# Patient Record
Sex: Female | Born: 1990 | Hispanic: No | Marital: Single | Smoking: Former smoker
Health system: Southern US, Community
[De-identification: ages and names within clinical notes are randomized; demographics above are authoritative.]

## PROBLEM LIST (undated history)

## (undated) DIAGNOSIS — F419 Anxiety disorder, unspecified: Secondary | ICD-10-CM

## (undated) DIAGNOSIS — F909 Attention-deficit hyperactivity disorder, unspecified type: Secondary | ICD-10-CM

## (undated) DIAGNOSIS — D649 Anemia, unspecified: Secondary | ICD-10-CM

## (undated) DIAGNOSIS — F32A Depression, unspecified: Secondary | ICD-10-CM

## (undated) DIAGNOSIS — F329 Major depressive disorder, single episode, unspecified: Secondary | ICD-10-CM

## (undated) HISTORY — DX: Anemia, unspecified: D64.9

## (undated) HISTORY — DX: Attention-deficit hyperactivity disorder, unspecified type: F90.9

## (undated) HISTORY — PX: WISDOM TOOTH EXTRACTION: SHX21

## (undated) HISTORY — PX: EYE SURGERY: SHX253

---
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
## 2011-01-27 ENCOUNTER — Encounter (INDEPENDENT_AMBULATORY_CARE_PROVIDER_SITE_OTHER): Payer: Medicaid Other | Admitting: Psychology

## 2011-01-27 ENCOUNTER — Encounter (HOSPITAL_COMMUNITY): Payer: Medicaid Other | Admitting: Psychology

## 2011-01-27 DIAGNOSIS — F411 Generalized anxiety disorder: Secondary | ICD-10-CM

## 2014-09-09 ENCOUNTER — Encounter (HOSPITAL_COMMUNITY): Payer: Self-pay | Admitting: *Deleted

## 2014-09-09 ENCOUNTER — Emergency Department (HOSPITAL_COMMUNITY)
Admission: EM | Admit: 2014-09-09 | Discharge: 2014-09-09 | Disposition: A | Discharge status: Home / Self Care | Payer: BLUE CROSS/BLUE SHIELD | Attending: Emergency Medicine | Admitting: Emergency Medicine

## 2014-09-09 DIAGNOSIS — F332 Major depressive disorder, recurrent severe without psychotic features: Secondary | ICD-10-CM

## 2014-09-09 DIAGNOSIS — F419 Anxiety disorder, unspecified: Secondary | ICD-10-CM | POA: Insufficient documentation

## 2014-09-09 DIAGNOSIS — Z79899 Other long term (current) drug therapy: Secondary | ICD-10-CM | POA: Insufficient documentation

## 2014-09-09 DIAGNOSIS — F329 Major depressive disorder, single episode, unspecified: Secondary | ICD-10-CM | POA: Insufficient documentation

## 2014-09-09 DIAGNOSIS — F1310 Sedative, hypnotic or anxiolytic abuse, uncomplicated: Secondary | ICD-10-CM | POA: Diagnosis not present

## 2014-09-09 DIAGNOSIS — Z3202 Encounter for pregnancy test, result negative: Secondary | ICD-10-CM | POA: Diagnosis not present

## 2014-09-09 DIAGNOSIS — F32A Depression, unspecified: Secondary | ICD-10-CM

## 2014-09-09 HISTORY — DX: Depression, unspecified: F32.A

## 2014-09-09 HISTORY — DX: Anxiety disorder, unspecified: F41.9

## 2014-09-09 HISTORY — DX: Major depressive disorder, recurrent severe without psychotic features: F33.2

## 2014-09-09 HISTORY — DX: Major depressive disorder, single episode, unspecified: F32.9

## 2014-09-09 LAB — CBC
HEMATOCRIT: 43 % (ref 36.0–46.0)
HEMOGLOBIN: 14.4 g/dL (ref 12.0–15.0)
MCH: 29.7 pg (ref 26.0–34.0)
MCHC: 33.5 g/dL (ref 30.0–36.0)
MCV: 88.7 fL (ref 78.0–100.0)
Platelets: 176 10*3/uL (ref 150–400)
RBC: 4.85 MIL/uL (ref 3.87–5.11)
RDW: 13 % (ref 11.5–15.5)
WBC: 4.4 10*3/uL (ref 4.0–10.5)

## 2014-09-09 LAB — COMPREHENSIVE METABOLIC PANEL
ALT: 11 U/L (ref 0–35)
AST: 15 U/L (ref 0–37)
Albumin: 5 g/dL (ref 3.5–5.2)
Alkaline Phosphatase: 57 U/L (ref 39–117)
Anion gap: 8 (ref 5–15)
BUN: 15 mg/dL (ref 6–23)
CO2: 26 mmol/L (ref 19–32)
CREATININE: 0.68 mg/dL (ref 0.50–1.10)
Calcium: 9.4 mg/dL (ref 8.4–10.5)
Chloride: 105 mmol/L (ref 96–112)
GFR calc Af Amer: >90 mL/min (ref >90)
Glucose, Bld: 88 mg/dL (ref 70–99)
Potassium: 3.7 mmol/L (ref 3.5–5.1)
Sodium: 139 mmol/L (ref 135–145)
Total Bilirubin: 0.8 mg/dL (ref 0.3–1.2)
Total Protein: 7.7 g/dL (ref 6.0–8.3)

## 2014-09-09 LAB — ACETAMINOPHEN LEVEL: Acetaminophen (Tylenol), Serum: <10 ug/mL — ABNORMAL LOW (ref 10–30)

## 2014-09-09 LAB — RAPID URINE DRUG SCREEN, HOSP PERFORMED
AMPHETAMINES: NOT DETECTED
BARBITURATES: NOT DETECTED
Benzodiazepines: POSITIVE — AB
COCAINE: NOT DETECTED
Opiates: NOT DETECTED
TETRAHYDROCANNABINOL: NOT DETECTED

## 2014-09-09 LAB — PREGNANCY, URINE: Preg Test, Ur: NEGATIVE

## 2014-09-09 LAB — ETHANOL: Alcohol, Ethyl (B): <5 mg/dL (ref 0–9)

## 2014-09-09 LAB — SALICYLATE LEVEL: Salicylate Lvl: <4 mg/dL (ref 2.8–20.0)

## 2014-09-09 NOTE — ED Notes (Signed)
Patient overwhelmed with college and work. She has also been off of her medication for depression due changes in  Insurance.

## 2014-09-09 NOTE — ED Notes (Signed)
Family took home pt belongings

## 2014-09-09 NOTE — ED Notes (Signed)
Bed: [facility] Expected date:  Expected time:  Means of arrival:  Comments: Hold for triage 4 

## 2014-09-09 NOTE — Progress Notes (Signed)
pcp is camille andy EPIC updated

## 2014-09-09 NOTE — ED Notes (Signed)
Family at bedside. 

## 2014-09-09 NOTE — BH Assessment (Addendum)
Tele Assessment Note   Shelly Hill is an 24 y.o. female who came to the Emergency Department due to feeling"overwhelmed and unable to function in daily tasks". She states she is in her last year of college at [employer] and is overwhelmed and anxious about finishing school. She states she was taking medication up until last July when her insurance changed from [place] to [place]. She states she thought she would be ok without her medication and didn't think she could afford it without the Medicaid. She reports now having panic attacks almost daily and is not eating or sleeping. She states she has been having some suicidal thoughts and "is afraid of what would happen if she went home today". She states that she used to take Celexa  daily and xanex .25 as needed. She says she had some left over Celexa and tried to restart herself on the medication over the past 5 days. Pt denies HI or A/V hallucinations but endorses depression and overwhelming anxiety. She denies any abuse of substances.   Disposition: inpatient treatment recommended by Julieanne Cotton NP  Axis I: 296.23 Major Depressive Disorder, single episode, severe, 300.02 Generalized Anxiety Disorder Axis II: Deferred Axis III:  Past Medical History  Diagnosis Date  . Depression   . Anxiety    Axis IV: educational problems and other psychosocial or environmental problems Axis V: 41-50 serious symptoms  Past Medical History:  Past Medical History  Diagnosis Date  . Depression   . Anxiety     History reviewed. No pertinent past surgical history.  Family History: No family history on file.  Social History:  reports that she has never smoked. She does not have any smokeless tobacco history on file. She reports that she does not drink alcohol. Her drug history is not on file.  Additional Social History:  Alcohol / Drug Use History of alcohol / drug use?: No history of alcohol / drug abuse  CIWA: CIWA-Ar BP: 128/81 mmHg Pulse Rate:  73 COWS:    PATIENT STRENGTHS: (choose at least two) Average or above average intelligence General fund of knowledge Motivation for treatment/growth Supportive family/friends  Allergies: No Known Allergies  Home Medications:  (Not in a hospital admission)  OB/GYN Status:  Patient's last menstrual period was 08/31/2014.  General Assessment Data Location of Assessment: [facility] Is this a Tele or Face-to-Face Assessment?: Face-to-Face Is this an Initial Assessment or a Re-assessment for this encounter?: Initial Assessment Living Arrangements: Non-relatives/Friends Can pt return to current living arrangement?: Yes Admission Status: Voluntary Is patient capable of signing voluntary admission?: Yes Transfer from: Home     [facility] Crisis Care Plan Living Arrangements: Non-relatives/Friends Name of Psychiatrist: Dr. Angelina Pih Name of Therapist: Rosario Jacks  Education Status Is patient currently in school?: Yes Current Grade: College Highest grade of school patient has completed: 12th  Name of school: [employer]  Risk to self with the past 6 months Suicidal Ideation: Yes-Currently Present Suicidal Intent: No Is patient at risk for suicide?: Yes Suicidal Plan?: No Access to Means: No What has been your use of drugs/alcohol within the last 12 months?: Denies substance abuse Previous Attempts/Gestures: No How many times?: 0 Other Self Harm Risks: none Triggers for Past Attempts: None known Intentional Self Injurious Behavior: None Family Suicide History: No Recent stressful life event(s): Other (Comment) (Last semester of college) Persecutory voices/beliefs?: No Depression: Yes Depression Symptoms: Despondent, Tearfulness Substance abuse history and/or treatment for substance abuse?: No Suicide prevention information given to non-admitted patients: Not applicable  Risk to Others within the past 6 months Homicidal Ideation: No Thoughts of Harm to Others: No Current Homicidal  Intent: No Current Homicidal Plan: No Access to Homicidal Means: No Identified Victim: None (None) History of harm to others?: No Assessment of Violence: None Noted Violent Behavior Description: None Does patient have access to weapons?: No Criminal Charges Pending?: No Does patient have a court date: No  Psychosis Hallucinations: None noted Delusions: None noted  Mental Status Report Appear/Hygiene: Unremarkable Eye Contact: Good Motor Activity: Freedom of movement Speech: Unremarkable Level of Consciousness: Alert Mood: Depressed, Anxious Affect: Appropriate to circumstance Anxiety Level: Panic Attacks Panic attack frequency: "almost daily" Most recent panic attack: today Thought Processes: Coherent Judgement: Unimpaired Orientation: Person, Place, Time, Situation Obsessive Compulsive Thoughts/Behaviors: Unable to Assess  Cognitive Functioning Concentration: Decreased Memory: Recent Intact, Remote Intact IQ: Average Insight: Fair Impulse Control: Good Appetite: Poor Sleep: Decreased Total Hours of Sleep:  (4)  ADLScreening [facility] Assessment Services) Patient's cognitive ability adequate to safely complete daily activities?: Yes Patient able to express need for assistance with ADLs?: Yes Independently performs ADLs?: Yes (appropriate for developmental age)  Prior Inpatient Therapy Prior Inpatient Therapy: No  Prior Outpatient Therapy Prior Outpatient Therapy: Yes Prior Therapy Dates:  (july 2015) Prior Therapy Facilty/Provider(s): [facility] Reason for Treatment: Anxiety  ADL Screening (condition at time of admission) Patient's cognitive ability adequate to safely complete daily activities?: Yes Is the patient deaf or have difficulty hearing?: No Does the patient have difficulty concentrating, remembering, or making decisions?: No Patient able to express need for assistance with ADLs?: Yes Does the patient have difficulty dressing or bathing?:  No Independently performs ADLs?: Yes (appropriate for developmental age) Does the patient have difficulty walking or climbing stairs?: No Weakness of Legs: None Weakness of Arms/Hands: None  Home Assistive Devices/Equipment Home Assistive Devices/Equipment: None  Therapy Consults (therapy consults require a physician order) PT Evaluation Needed: No OT Evalulation Needed: No SLP Evaluation Needed: No Abuse/Neglect Assessment (Assessment to be complete while patient is alone) Physical Abuse: Denies Verbal Abuse: Denies Sexual Abuse: Denies Exploitation of patient/patient's resources: Denies Self-Neglect: Denies Values / Beliefs Cultural Requests During Hospitalization: None Spiritual Requests During Hospitalization: None Consults Spiritual Care Consult Needed: No Social Work Consult Needed: No Merchant navy officerAdvance Directives (For Healthcare) Does patient have an advance directive?: No Would patient like information on creating an advanced directive?: No - patient declined information    Additional Information 1:1 In Past 12 Months?: No CIRT Risk: No Elopement Risk: No Does patient have medical clearance?: Yes     Disposition:  Disposition Initial Assessment Completed for this Encounter: Yes Disposition of Patient: Inpatient treatment program Type of inpatient treatment program: Adult  Amarie Viles 09/09/2014 2:56 PM

## 2014-09-09 NOTE — B[facility] Counselor (Signed)
[facility] accepting Dr. Caroline MoreKomissarva report # [phone]  Kateri PlummerKristin Kahil Agner, M.S., LPCA, [place]AS[place], [facility] Licensed Professional Counselor Associate  Triage Specialist  [facility]  Therapeutic Triage Services Phone: [phone] Fax: [phone]

## 2014-09-09 NOTE — ED Notes (Signed)
Pt states she has been feeling increasingly depressed and anxious for the past 2 weeks. Pt was on citalopram for 3 years, then stopped in July of 2015 due to her new insurance not covering the medication. Pt started back on citalopram 5 days ago. Pt states she feels stressed with school. Pt is currently a senior at [employer]. Pt states she has had a lack of appetite for the past 2 weeks and has been unable to eat much. Pt denies HI, states she has thoughts of suicide without intent.

## 2014-09-09 NOTE — BH Assessment (Signed)
Writer informed TTS (K.Chesire) of the consult.  

## 2014-09-09 NOTE — ED Provider Notes (Signed)
CSN: [id number]     Arrival date & time 09/09/14  1148 History   First MD Initiated Contact with Patient 09/09/14 1332     Chief Complaint  Patient presents with  . Depression  . Anxiety     (Consider location/radiation/quality/duration/timing/severity/associated sxs/prior Treatment) Patient is a 24 y.o. female presenting with altered mental status. The history is provided by the patient (the pt complains of depression and having suicidal thoughts).  Altered Mental Status Presenting symptoms: no confusion   Severity:  Severe Most recent episode:  More than 2 days ago Episode history:  Continuous Timing:  Constant Progression:  Worsening Chronicity:  Recurrent Context: not dementia   Associated symptoms: no abdominal pain, no hallucinations, no headaches, no rash and no seizures     Past Medical History  Diagnosis Date  . Depression   . Anxiety    History reviewed. No pertinent past surgical history. No family history on file. History  Substance Use Topics  . Smoking status: Never Smoker   . Smokeless tobacco: Not on file  . Alcohol Use: No   OB History    No data available     Review of Systems  Constitutional: Negative for appetite change and fatigue.  HENT: Negative for congestion, ear discharge and sinus pressure.   Eyes: Negative for discharge.  Respiratory: Negative for cough.   Cardiovascular: Negative for chest pain.  Gastrointestinal: Negative for abdominal pain and diarrhea.  Genitourinary: Negative for frequency and hematuria.  Musculoskeletal: Negative for back pain.  Skin: Negative for rash.  Neurological: Negative for seizures and headaches.  Psychiatric/Behavioral: Positive for dysphoric mood. Negative for hallucinations and confusion.      Allergies  Review of patient's allergies indicates no known allergies.  Home Medications   Prior to Admission medications   Medication Sig Start Date End Date Taking? Authorizing Provider  ALPRAZolam  (XANAX) 0.25 MG tablet Take 0.25 mg by mouth 2 (two) times daily as needed for anxiety.   Yes Historical Provider, MD  citalopram (CELEXA) 10 MG tablet Take 10 mg by mouth at bedtime.   Yes Historical Provider, MD  ibuprofen (ADVIL,MOTRIN) 200 MG tablet Take 400 mg by mouth every 6 (six) hours as needed (pain).   Yes Historical Provider, MD   BP 128/81 mmHg  Pulse 73  Temp(Src) 98.2 F (36.8 C) (Oral)  Resp 18  SpO2 100%  LMP 08/31/2014 Physical Exam  Constitutional: She is oriented to person, place, and time. She appears well-developed.  HENT:  Head: Normocephalic.  Eyes: Conjunctivae and EOM are normal. No scleral icterus.  Neck: Neck supple. No thyromegaly present.  Cardiovascular: Normal rate and regular rhythm.  Exam reveals no gallop and no friction rub.   No murmur heard. Pulmonary/Chest: No stridor. She has no wheezes. She has no rales. She exhibits no tenderness.  Abdominal: She exhibits no distension. There is no tenderness. There is no rebound.  Musculoskeletal: Normal range of motion. She exhibits no edema.  Lymphadenopathy:    She has no cervical adenopathy.  Neurological: She is oriented to person, place, and time. She exhibits normal muscle tone. Coordination normal.  Skin: No rash noted. No erythema.  Psychiatric:  Pt is depressed and having some suicidal thoughts no plan    ED Course  Procedures (including critical care time) Labs Review Labs Reviewed  ACETAMINOPHEN LEVEL - Abnormal; Notable for the following:    Acetaminophen (Tylenol), Serum <10.0 (*)    All other components within normal limits  URINE RAPID DRUG SCREEN (  HOSP PERFORMED) - Abnormal; Notable for the following:    Benzodiazepines POSITIVE (*)    All other components within normal limits  CBC  COMPREHENSIVE METABOLIC PANEL  ETHANOL  SALICYLATE LEVEL  PREGNANCY, URINE    Imaging Review No results found.   EKG Interpretation None      MDM   Final diagnoses:  None    Admit to  psyc for depression    Bethann BerkshireJoseph Farrie Sann, MD 09/09/14 1536

## 2014-09-09 NOTE — BH Assessment (Signed)
B[facility] Assessment Progress Note  This pt has been referred to the following facilities with results as noted:  Beds available, information faxed, decision pending[facility]  No beds available, but willing to accept referral:  [facility]  Krissi Willaims, [place] Triage Specialist 09/09/2014 @ 16:37

## 2014-09-09 NOTE — Consult Note (Signed)
[facility]   Reason for Consult: Recurrent major depression, Anxiety disorder Referring Physician: EDP Patient Identification: Shelly Dingwallllison Ricardo MRN:  [id number] Principal Diagnosis: Severe recurrent major depression without psychotic features Diagnosis:   Patient Active Problem List   Diagnosis Date Noted  . Severe recurrent major depression without psychotic features [F33.2] 09/09/2014    Priority: High    Total Time spent with patient: 1 hour  Subjective:   Shelly Hill is a 24 y.o. female patient admitted with Recurrent major depression, Anxiety.  HPI:  Caucasian student female was evaluated for recurrent Major depression.  She stated that she stopped taking her Celexa last July when her insurance changed.  She did so hoping that her Depression will not be recurring.  She also stated that combined with her school work and working in the evening at a coffee shop, she started feeling depressed and anxious.  She reported having suicidal thought last week with no plans.  Patient was tearful during the assessment.  She is a final year student and stated that she is overwhelmed.  Today she denies SI/HI/AVH and she denies use  Of drug or Alcohol.  She denies any previous hx of wanting to hurt self.  She is accepted for admission and we will be looking for inpatient Psychiatric bed for admission.  HPI Elements:   Location:  Recurrent Major depression, anxiety disorder, . Quality:  severe, hypersomnia, loss of appetite, anxiety. Severity:  severe. Timing:  Acute. Duration:  Chronic depression. Context:  Seeking treatment for depression..  Past Medical History:  Past Medical History  Diagnosis Date  . Depression   . Anxiety    History reviewed. No pertinent past surgical history. Family History: No family history on file. Social History:  History  Alcohol Use No     History  Drug Use Not on file    History   Social History  . Marital Status: Single     Spouse Name: N/A  . Number of Children: N/A  . Years of Education: N/A   Social History Main Topics  . Smoking status: Never Smoker   . Smokeless tobacco: Not on file  . Alcohol Use: No  . Drug Use: Not on file  . Sexual Activity: Not on file   Other Topics Concern  . None   Social History Narrative  . None   Additional Social History:    History of alcohol / drug use?: No history of alcohol / drug abuse    Allergies:  No Known Allergies  Vitals: Blood pressure 128/81, pulse 73, temperature 98.2 F (36.8 C), temperature source Oral, resp. rate 18, last menstrual period 08/31/2014, SpO2 100 %.  Risk to Self: Suicidal Ideation: Yes-Currently Present Suicidal Intent: No Is patient at risk for suicide?: Yes Suicidal Plan?: No Access to Means: No What has been your use of drugs/alcohol within the last 12 months?: Denies substance abuse How many times?: 0 Other Self Harm Risks: none Triggers for Past Attempts: None known Intentional Self Injurious Behavior: None Risk to Others: Homicidal Ideation: No Thoughts of Harm to Others: No Current Homicidal Intent: No Current Homicidal Plan: No Access to Homicidal Means: No Identified Victim: None (None) History of harm to others?: No Assessment of Violence: None Noted Violent Behavior Description: None Does patient have access to weapons?: No Criminal Charges Pending?: No Does patient have a court date: No Prior Inpatient Therapy: Prior Inpatient Therapy: No Prior Outpatient Therapy: Prior Outpatient Therapy: Yes Prior Therapy Dates:  (july 2015)  Prior Therapy Facilty/Provider(s): [facility] Reason for Treatment: Anxiety  No current facility-administered medications for this encounter.   Current Outpatient Prescriptions  Medication Sig Dispense Refill  . ALPRAZolam (XANAX) 0.25 MG tablet Take 0.25 mg by mouth 2 (two) times daily as needed for anxiety.    . citalopram (CELEXA) 10 MG tablet Take 10 mg by mouth at  bedtime.    Marland Kitchen ibuprofen (ADVIL,MOTRIN) 200 MG tablet Take 400 mg by mouth every 6 (six) hours as needed (pain).      Musculoskeletal: Strength & Muscle Tone: within normal limits Gait & Station: normal Patient leans: N/A  Psychiatric Specialty Exam:     Blood pressure 128/81, pulse 73, temperature 98.2 F (36.8 C), temperature source Oral, resp. rate 18, last menstrual period 08/31/2014, SpO2 100 %.There is no height or weight on file to calculate BMI.  General Appearance: Casual and Fairly Groomed  Eye Contact::  Good  Speech:  Clear and Coherent and Normal Rate  Volume:  Normal  Mood:  Anxious and Depressed  Affect:  Congruent, Depressed and Tearful  Thought Process:  Coherent, Goal Directed and Intact  Orientation:  Full (Time, Place, and Person)  Thought Content:  WDL  Suicidal Thoughts:  No  Homicidal Thoughts:  No  Memory:  Immediate;   Good Recent;   Good Remote;   Good  Judgement:  Good  Insight:  Good  Psychomotor Activity:  Normal  Concentration:  Good  Recall:  Good  Fund of Knowledge:Good  Language: Good  Akathisia:  NA  Handed:  Right  AIMS (if indicated):     Assets:  Desire for Improvement  ADL's:  Intact  Cognition: WNL  Sleep:      Medical Decision Making: Established Problem, Worsening (2), Review of Medication Regimen & Side Effects (2) and Review of New Medication or Change in Dosage (2)  Treatment Plan Summary: Daily contact with patient to assess and evaluate symptoms and progress in treatment, Medication management and Plan Admit to inpatient Psychiatric unit  Plan:  Recommend psychiatric Inpatient admission when medically cleared. Disposition: see above  Earney Navy   PMHNP-BC 09/09/2014 4:26 PM Patient seen face-to-face for psychiatric evaluation, chart reviewed and case discussed with the physician extender and developed treatment plan. Reviewed the information documented and agree with the treatment plan. Thedore Mins, MD

## 2019-04-17 ENCOUNTER — Other Ambulatory Visit: Payer: Self-pay

## 2019-04-17 ENCOUNTER — Ambulatory Visit
Admission: EM | Admit: 2019-04-17 | Discharge: 2019-04-17 | Disposition: A | Discharge status: Home / Self Care | Payer: BLUE CROSS/BLUE SHIELD

## 2019-04-17 DIAGNOSIS — R110 Nausea: Secondary | ICD-10-CM | POA: Insufficient documentation

## 2019-04-17 DIAGNOSIS — Z3202 Encounter for pregnancy test, result negative: Secondary | ICD-10-CM

## 2019-04-17 DIAGNOSIS — Z20822 Contact with and (suspected) exposure to covid-19: Secondary | ICD-10-CM

## 2019-04-17 DIAGNOSIS — F329 Major depressive disorder, single episode, unspecified: Secondary | ICD-10-CM | POA: Insufficient documentation

## 2019-04-17 DIAGNOSIS — Z20828 Contact with and (suspected) exposure to other viral communicable diseases: Secondary | ICD-10-CM | POA: Insufficient documentation

## 2019-04-17 DIAGNOSIS — F3289 Other specified depressive episodes: Secondary | ICD-10-CM

## 2019-04-17 DIAGNOSIS — F411 Generalized anxiety disorder: Secondary | ICD-10-CM

## 2019-04-17 DIAGNOSIS — R140 Abdominal distension (gaseous): Secondary | ICD-10-CM | POA: Insufficient documentation

## 2019-04-17 DIAGNOSIS — F32A Depression, unspecified: Secondary | ICD-10-CM

## 2019-04-17 DIAGNOSIS — R109 Unspecified abdominal pain: Secondary | ICD-10-CM

## 2019-04-17 LAB — POCT URINALYSIS DIP (MANUAL ENTRY)
Bilirubin, UA: NEGATIVE
Glucose, UA: NEGATIVE mg/dL
Ketones, POC UA: NEGATIVE mg/dL
Leukocytes, UA: NEGATIVE
Nitrite, UA: NEGATIVE
Protein Ur, POC: NEGATIVE mg/dL
Spec Grav, UA: 1.02 (ref 1.010–1.025)
Urobilinogen, UA: 0.2 E.U./dL
pH, UA: 7.5 (ref 5.0–8.0)

## 2019-04-17 LAB — POCT URINE PREGNANCY: Preg Test, Ur: NEGATIVE

## 2019-04-17 MED ORDER — DICYCLOMINE HCL 20 MG PO TABS: 20.0000 mg | ORAL_TABLET | Freq: Two times a day (BID) | ORAL | 0 refills | Status: DC

## 2019-04-17 MED ORDER — HYDROXYZINE HCL 25 MG PO TABS: 25.0000 mg | ORAL_TABLET | Freq: Four times a day (QID) | ORAL | 0 refills | Status: DC | PRN

## 2019-04-17 NOTE — Discharge Instructions (Signed)
COVID symptoms: Nausea and diarrhea Urine did not show signs of infection.  Some trace blood Urine pregnancy was negative Urine culture sent.  We will call you with abnormal results.   COVID testing ordered.  It will take between 5-7 days for test results.  Someone will contact you regarding abnormal results.    In the meantime: You should remain isolated in your home for 10 days from symptom onset AND greater than 72 hours after symptoms resolution (absence of fever without the use of fever-reducing medication and improvement in respiratory symptoms), whichever is longer Get plenty of rest and push fluids Declines stool studies at this time Based on symptoms we will try bentyl for abdominal cramping, diarrhea, and nausea Continue with zofran as needed Use OTC medications like ibuprofen or tylenol as needed fever or pain Follow up with Dr. Holly Bodily to establish care and for further evaluation and management in symptoms persists Call or go to the ED if you have any new or worsening symptoms such as fever, worsening cough, shortness of breath, chest tightness, chest pain, turning blue, changes in mental status, etc...   Depression/ Anxiety: Rest and drink fluids Eat a well-balanced diet, and avoid excessive caffeine intake Hydroxyzine prescribed.  Take as directed for anxiety Some things you may try doing to help alleviate your symptoms include: keeping a journal, exercise, talking to a friend or relative, listening to music, going for a walk or hike outside, or other activities that you may find enjoyable Follow up with Dr. Holly Bodily for further evaluation and management  Return or go to ER if you have any new or worsening symptoms such as suicidal or homicidal thoughts, fever, chills, fatigue, worsening shortness of breath, wheezing, chest pain, nausea, vomiting, abdominal pain, changes in bowel or bladder habits, etc..Marland Kitchen

## 2019-04-17 NOTE — ED Provider Notes (Addendum)
[facility]   [id number] 04/17/19 Arrival Time: 1354   CC: Nausea, diarrhea; anxiety and depression  SUBJECTIVE: History from: patient.  Shelly Dingwallllison Newsom is a 28 y.o. female who presents with nausea and watery/loose diarrhea, x 1-2 episodes daily, for the past week.  Denies sick exposure to COVID, flu or strep.  Denies recent travel.  Denies changes in diet, or medication changes.  Has tried zofran and diet changes (FODMAP diet) with minimal relief.  Keep sip of water down without difficulty.  Symptoms are made worse with anxious situation and "getting up."  Denies previous symptoms in the past.   Also mentions associated fatigue, rhinorrhea, congestion, abdominal bloating, and abdominal cramping.  Denies fever, chills, sore throat, cough, SOB, wheezing, chest pain, vomiting, melena, hematochezia, dysuria, urinary frequency or urgency, vaginal discharge, vaginal itching, vaginal pain.    Patient's last menstrual period was 03/13/2019.  [facility]  Took a home pregnancy test that was negative.  Is sexually active.    Patient also complains of worsening depression and anxiety for the past couple of weeks.  Has struggled with anxiety and depression for approximately 10 years.  Denies a precipitating event, or recent stresor.  Currently taking wellbutrin 300 mg and propanolol 20 mg.  States symptoms have been well controlled until recently.  Reports similar symptoms in the past that improved with hydroxyzine. Currently being followed by a psychiatrist, but recently moved and is not established with a PCP in town.  Denies HI or SI.  Reports "brain fog," lightheadedness, fatigue, nausea, and increased crying spells. Patient denies fever, chills, anhedonia, difficulty sleeping, changes in normal activities.  ROS: As per HPI.  All other pertinent ROS negative.     Past Medical History:  Diagnosis Date  . Anxiety   . Depression    History reviewed. No pertinent surgical history. No  Known Allergies No current facility-administered medications on file prior to encounter.    Current Outpatient Medications on File Prior to Encounter  Medication Sig Dispense Refill  . buPROPion (WELLBUTRIN XL) 300 MG 24 hr tablet Take 300 mg by mouth daily.    . drospirenone-ethinyl estradiol (YAZ) 3-0.02 MG tablet Take by mouth.    . ondansetron (ZOFRAN) 4 MG tablet Take 4 mg by mouth every 8 (eight) hours as needed for nausea or vomiting.    Marland Kitchen. ALPRAZolam (XANAX) 0.25 MG tablet Take 0.25 mg by mouth 2 (two) times daily as needed for anxiety.    Marland Kitchen. ibuprofen (ADVIL,MOTRIN) 200 MG tablet Take 400 mg by mouth every 6 (six) hours as needed (pain).    . propranolol (INDERAL) 10 MG tablet Take 20 mg by mouth 3 (three) times daily.    . [DISCONTINUED] citalopram (CELEXA) 10 MG tablet Take 10 mg by mouth at bedtime.     Social History   Socioeconomic History  . Marital status: Single    Spouse name: Not on file  . Number of children: Not on file  . Years of education: Not on file  . Highest education level: Not on file  Occupational History  . Not on file  Social Needs  . Financial resource strain: Not on file  . Food insecurity    Worry: Not on file    Inability: Not on file  . Transportation needs    Medical: Not on file    Non-medical: Not on file  Tobacco Use  . Smoking status: Never Smoker  Substance and Sexual Activity  . Alcohol use: No  .  Drug use: Not on file  . Sexual activity: Not on file  Lifestyle  . Physical activity    Days per week: Not on file    Minutes per session: Not on file  . Stress: Not on file  Relationships  . Social Herbalist on phone: Not on file    Gets together: Not on file    Attends religious service: Not on file    Active member of club or organization: Not on file    Attends meetings of clubs or organizations: Not on file    Relationship status: Not on file  . Intimate partner violence    Fear of current or ex partner: Not on  file    Emotionally abused: Not on file    Physically abused: Not on file    Forced sexual activity: Not on file  Other Topics Concern  . Not on file  Social History Narrative  . Not on file   History reviewed. No pertinent family history.  OBJECTIVE:  Vitals:   04/17/19 1408  BP: 118/79  Pulse: 87  Resp: 20  Temp: 98.3 F (36.8 C)  SpO2: 96%    General appearance: alert; appears mildly fatigued, but nontoxic; speaking in full sentences and tolerating own secretions HEENT: NCAT; Ears: EACs clear, TMs pearly gray; Eyes: PERRL.  EOM grossly intact.  Nose: nares patent without rhinorrhea, Throat: oropharynx clear, tonsils non erythematous or enlarged, uvula midline  Neck: supple without LAD Lungs: unlabored respirations, symmetrical air entry; cough: absent; no respiratory distress; CTAB Heart: regular rate and rhythm.  Radial pulses 2+ symmetrical bilaterally  Back: no CVA tenderness Abdomen: soft, nondistended, normal active bowel sounds; NTTP; no guarding  Skin: warm and dry Psychological: alert and cooperative; mildly depressed mood and affect  ASSESSMENT & PLAN:  1. Suspected COVID-19 virus infection   2. Nausea without vomiting   3. Abdominal cramping   4. Abdominal bloating   5. Generalized anxiety disorder   6. Depression, unspecified depression type     Meds ordered this encounter  Medications  . dicyclomine (BENTYL) 20 MG tablet    Sig: Take 1 tablet (20 mg total) by mouth 2 (two) times daily.    Dispense:  20 tablet    Refill:  0    Order Specific Question:   Supervising Provider    Answer:   Raylene Everts [[id number]]  . hydrOXYzine (ATARAX/VISTARIL) 25 MG tablet    Sig: Take 1 tablet (25 mg total) by mouth every 6 (six) hours as needed for anxiety or nausea.    Dispense:  30 tablet    Refill:  0    Order Specific Question:   Supervising Provider    Answer:   Raylene Everts [[id number]]   COVID symptoms: Nausea and diarrhea Urine did not show  signs of infection.  Some trace blood Urine pregnancy was negative Urine culture sent.  We will call you with abnormal results.   COVID testing ordered.  It will take between 5-7 days for test results.  Someone will contact you regarding abnormal results.    In the meantime: You should remain isolated in your home for 10 days from symptom onset AND greater than 72 hours after symptoms resolution (absence of fever without the use of fever-reducing medication and improvement in respiratory symptoms), whichever is longer Get plenty of rest and push fluids Declines stool studies at this time Based on symptoms we will try bentyl for abdominal cramping, diarrhea, and  nausea Continue with zofran as needed Use OTC medications like ibuprofen or tylenol as needed fever or pain Follow up with Dr. Judee Clara to establish care and for further evaluation and management in symptoms persists Call or go to the ED if you have any new or worsening symptoms such as fever, worsening cough, shortness of breath, chest tightness, chest pain, turning blue, changes in mental status, etc...   Depression/ Anxiety: Rest and drink fluids Eat a well-balanced diet, and avoid excessive caffeine intake Hydroxyzine prescribed.  Take as directed for anxiety Some things you may try doing to help alleviate your symptoms include: keeping a journal, exercise, talking to a friend or relative, listening to music, going for a walk or hike outside, or other activities that you may find enjoyable Follow up with Dr. Judee Clara for further evaluation and management  Return or go to ER if you have any new or worsening symptoms such as suicidal or homicidal thoughts, fever, chills, fatigue, worsening shortness of breath, wheezing, chest pain, nausea, vomiting, abdominal pain, changes in bowel or bladder habits, etc...   Reviewed expectations re: course of current medical issues. Questions answered. Outlined signs and symptoms indicating need for more  acute intervention. Patient verbalized understanding. After Visit Summary given.         Rennis Harding, PA-C 04/17/19 1509    Rennis Harding, PA-C 04/17/19 1511

## 2019-04-17 NOTE — ED Triage Notes (Signed)
Pt presents with c/o of nausea and dizziness and also c/o indigestion for past few weeks

## 2019-04-18 LAB — URINE CULTURE: Culture: <10000 — AB

## 2019-04-19 LAB — NOVEL CORONAVIRUS, NAA: SARS-CoV-2, NAA: NOT DETECTED

## 2019-04-20 ENCOUNTER — Telehealth (HOSPITAL_COMMUNITY): Payer: Self-pay | Admitting: Emergency Medicine

## 2019-04-20 NOTE — Telephone Encounter (Signed)
Pt called asking about covid test results. Results reported as negative. Pt also requesting we send her B12 injection prescriptions, pt informed she will have to contact her PCP to get her the medication. Pt verbalized understanding.

## 2019-05-03 ENCOUNTER — Ambulatory Visit: Payer: BLUE CROSS/BLUE SHIELD | Admitting: Family Medicine

## 2019-09-11 ENCOUNTER — Ambulatory Visit (INDEPENDENT_AMBULATORY_CARE_PROVIDER_SITE_OTHER): Payer: Self-pay | Admitting: Family Medicine

## 2019-09-11 ENCOUNTER — Encounter: Payer: Self-pay | Admitting: Family Medicine

## 2019-09-11 ENCOUNTER — Other Ambulatory Visit: Payer: Self-pay

## 2019-09-11 VITALS — Temp 83.0°F | Resp 16 | Ht 68.0 in | Wt 163.0 lb

## 2019-09-11 DIAGNOSIS — Z309 Encounter for contraceptive management, unspecified: Secondary | ICD-10-CM | POA: Insufficient documentation

## 2019-09-11 DIAGNOSIS — F332 Major depressive disorder, recurrent severe without psychotic features: Secondary | ICD-10-CM

## 2019-09-11 DIAGNOSIS — F322 Major depressive disorder, single episode, severe without psychotic features: Secondary | ICD-10-CM

## 2019-09-11 DIAGNOSIS — R110 Nausea: Secondary | ICD-10-CM

## 2019-09-11 MED ORDER — LORAZEPAM 0.5 MG PO TABS: 0.5000 mg | ORAL_TABLET | Freq: Every day | ORAL | 0 refills | Status: AC | PRN

## 2019-09-11 MED ORDER — DROSPIRENONE-ETHINYL ESTRADIOL 3-0.02 MG PO TABS: 1.0000 | ORAL_TABLET | Freq: Every day | ORAL | 11 refills | Status: DC

## 2019-09-11 MED ORDER — ESCITALOPRAM OXALATE 20 MG PO TABS: 20.0000 mg | ORAL_TABLET | Freq: Every day | ORAL | 0 refills | Status: DC

## 2019-09-11 MED ORDER — ONDANSETRON HCL 4 MG PO TABS: 4.0000 mg | ORAL_TABLET | Freq: Three times a day (TID) | ORAL | 0 refills | Status: AC | PRN

## 2019-09-11 NOTE — Progress Notes (Signed)
Subjective:  Patient ID: Shelly Hill, female    DOB: [birth year]  Age: 29 y.o. MRN: [id number]  CC:  Chief Complaint  Patient presents with  . Establish Care  . Anxiety  . Depression    BF dumped her by  test a few days ago. Struggling emotionally       HPI  HPI   Shelly Hill is a 29 year old female who presents today to establish care, but does not have insurance and would like help with finding places for care.  Significant history includes anxiety and depression she is extensively depressed at this time secondary to her current relationship ending within the last several days.  She was in a relationship for nearly 2 years with the same significant other.  Background history on her depression anxiety includes that she was on Effexor for an extended period of time she reports that it was helping keeping her even but it just was not helping her live her best life.  She was stopped on Effexor and started on Wellbutrin was on Wellbutrin for about 4 years.  It did not work for her she was also using hydroxyzine at the same time.  She missed a few days of these around the holidays.she reports that she started a new job so she went back on the Wellbutrin.  And she started having panic attacks and anxiety, headaches and triggered a deeper depression after starting.  She went to behavior UC in the [facility] area as that is where she was previously living before she moved back in with her folks.  She was-started lexapro this helped within a week- as not crying as much, other side effects had started easing off. She reports doing well over the last week. Then a few days ago he texted her he was not sure if he wanted a long term relationship, and ended things. This has triggered Several days of deep depression.  She reports she is not eating very well, reports loss of 10 pounds in the last month.  Reports she has a lot of nausea around her emotional status and needs a refill of her Zofran that she  normally uses as needed for this.  She reports that she just has to manage anxiety right now normally she does not use her Ativan she has had the prescription since September of last year but reports that she has had to use nearly all of it in the last couple months and several times in the last week..  She does not have insurance so she reports that she would like to refrain from being referred to any other office as she is going to follow-up with [facility] for her behavioral health needs.  Just would like to get a refill of her medications so that she can have them until she can be seen.  She denies drug use, alcohol use, smoking. Needs Pap smear.  And other care.  But is unable to go for this at this time.  So she has declined any of those.  Additionally needs updated labs but will declined this at this time to secondary to inability to pay for them.  Today patient denies signs and symptoms of COVID 19 infection including fever, chills, cough, shortness of breath, and headache. Past Medical, Surgical, Social History, Allergies, and Medications have been Reviewed.   Past Medical History:  Diagnosis Date  . Anxiety   . Depression   . Severe recurrent major depression without psychotic features (HCC)  09/09/2014    Current Meds  Medication Sig  . drospirenone-ethinyl estradiol (YAZ) 3-0.02 MG tablet Take 1 tablet by mouth daily.  Marland Kitchen escitalopram (LEXAPRO) 20 MG tablet Take 1 tablet (20 mg total) by mouth daily.  Marland Kitchen ibuprofen (ADVIL,MOTRIN) 200 MG tablet Take 400 mg by mouth every 6 (six) hours as needed (pain).  . LORazepam (ATIVAN) 0.5 MG tablet Take 1 tablet (0.5 mg total) by mouth daily as needed for anxiety.  . ondansetron (ZOFRAN) 4 MG tablet Take 1 tablet (4 mg total) by mouth every 8 (eight) hours as needed for nausea or vomiting.  . [DISCONTINUED] drospirenone-ethinyl estradiol (YAZ) 3-0.02 MG tablet Take by mouth.  . [DISCONTINUED] escitalopram (LEXAPRO) 10 MG tablet Take 10 mg by mouth  daily.  . [DISCONTINUED] LORazepam (ATIVAN) 0.5 MG tablet Take 0.5 mg by mouth daily as needed for anxiety.  . [DISCONTINUED] ondansetron (ZOFRAN) 4 MG tablet Take 4 mg by mouth every 8 (eight) hours as needed for nausea or vomiting.    ROS:  Review of Systems  Constitutional: Negative.   HENT: Negative.   Eyes: Negative.   Respiratory: Negative.   Cardiovascular: Negative.   Gastrointestinal: Negative.   Genitourinary: Negative.   Musculoskeletal: Negative.   Skin: Negative.   Neurological: Negative.   Endo/Heme/Allergies: Negative.   Psychiatric/Behavioral: Positive for depression. The patient is nervous/anxious.   All other systems reviewed and are negative.    Objective:   Today's Vitals: Temp (!) 83 F (28.3 C)   Resp 16   Ht 5\' 8"  (1.727 m)   Wt 163 lb (73.9 kg)   SpO2 98%   BMI 24.78 kg/m  Vitals with BMI 09/11/2019 04/17/2019 09/09/2014  Height 5\' 8"  - -  Weight 163 lbs - -  BMI 24.79 - -  Systolic - 118 125  Diastolic - 79 72  Pulse - 87 67     Physical Exam Constitutional:      Appearance: Normal appearance. She is normal weight.  HENT:     Head: Normocephalic.     Right Ear: External ear normal.     Left Ear: External ear normal.     Mouth/Throat:     Comments: Mask in place Cardiovascular:     Rate and Rhythm: Normal rate and regular rhythm.     Heart sounds: Normal heart sounds.  Pulmonary:     Effort: Pulmonary effort is normal.     Breath sounds: Normal breath sounds.  Musculoskeletal:        General: Normal range of motion.     Cervical back: Normal range of motion.  Neurological:     Mental Status: She is alert.  Psychiatric:        Attention and Perception: Attention normal.        Mood and Affect: Mood is depressed. Affect is tearful.        Speech: Speech normal.        Behavior: Behavior normal. Behavior is cooperative.        Thought Content: Thought content normal.      Assessment   1. Depression, major, single episode,  severe (HCC)   2. Nausea   3. Encounter for contraceptive management, unspecified type   4. Severe recurrent major depression without psychotic features (HCC)     Tests ordered No orders of the defined types were placed in this encounter.    Plan: Please see assessment and plan per problem list above.   Meds ordered this encounter  Medications  .  escitalopram (LEXAPRO) 20 MG tablet    Sig: Take 1 tablet (20 mg total) by mouth daily.    Dispense:  90 tablet    Refill:  0    Order Specific Question:   Supervising Provider    Answer:   SIMPSON, MARGARET E [[id number]]  . LORazepam (ATIVAN) 0.5 MG tablet    Sig: Take 1 tablet (0.5 mg total) by mouth daily as needed for anxiety.    Dispense:  30 tablet    Refill:  0    Order Specific Question:   Supervising Provider    Answer:   SIMPSON, MARGARET E [[id number]]  . drospirenone-ethinyl estradiol (YAZ) 3-0.02 MG tablet    Sig: Take 1 tablet by mouth daily.    Dispense:  1 Package    Refill:  11    Order Specific Question:   Supervising Provider    Answer:   SIMPSON, MARGARET E [[id number]]  . ondansetron (ZOFRAN) 4 MG tablet    Sig: Take 1 tablet (4 mg total) by mouth every 8 (eight) hours as needed for nausea or vomiting.    Dispense:  20 tablet    Refill:  0    Order Specific Question:   Supervising Provider    Answer:   Fayrene Helper [[id number]]    Patient to follow-up in 3 months.  Perlie Mayo, NP

## 2019-09-11 NOTE — Assessment & Plan Note (Deleted)
Strongly encouraged her to continue the Lexapro 20 mg.  Will refill Ativan.  Follow-up with [facility] any adjustment of medications they can handle as needed.  Secondary to not having insurance.  I have also recommended her going to the health department and or free clinic if needed.

## 2019-09-11 NOTE — Patient Instructions (Addendum)
I appreciate the opportunity to provide you with care for your health and wellness. Today we discussed: established care   Follow up: 3 months   No labs or referrals today  Prenatal vitamin  Vitamin B12: 1000 mcg daily  Medications sent [facility]  Call if any concerns  [facility] for depression and anxiety  Follow up soon with [facility] for pap and other medical needs.  Please continue to practice social distancing to keep you, your family, and our community safe.  If you must go out, please wear a mask and practice good handwashing.  It was a pleasure to see you and I look forward to continuing to work together on your health and well-being. Please do not hesitate to call the office if you need care or have questions about your care.  Have a wonderful day and week. With Gratitude, Tereasa Coop, DNP, AGNP-BC

## 2019-09-11 NOTE — Assessment & Plan Note (Signed)
Strongly encouraged her to continue the Lexapro 20 mg.  Will refill Ativan.  Follow-up with [facility] any adjustment of medications they can handle as needed.  Secondary to not having insurance.  I have also recommended her going to the health department and or free clinic if needed. 

## 2019-09-11 NOTE — Assessment & Plan Note (Signed)
Needs refill and follow-up on Pap smear.  Advised for her to go to the health department for Pap smear.

## 2019-09-11 NOTE — Assessment & Plan Note (Signed)
Refill provided on Zofran.

## 2019-09-23 ENCOUNTER — Encounter: Payer: Self-pay | Admitting: Family Medicine

## 2019-10-10 ENCOUNTER — Encounter: Payer: Self-pay | Admitting: Family Medicine

## 2019-10-12 NOTE — Telephone Encounter (Signed)
Please place referral for [facility] for ADHD treatment.  She might be better on Wellbutrin it is know to help with mood and ADHD symptoms.  If she wants to try that, we can make appt to follow up on it.

## 2019-10-15 NOTE — Telephone Encounter (Signed)
LVM for pt to call the office back to discuss

## 2019-10-18 ENCOUNTER — Other Ambulatory Visit: Payer: Self-pay

## 2019-10-18 ENCOUNTER — Telehealth: Payer: Self-pay | Admitting: Family Medicine

## 2019-12-06 ENCOUNTER — Ambulatory Visit: Payer: Self-pay | Admitting: Family Medicine

## 2019-12-10 ENCOUNTER — Other Ambulatory Visit: Payer: Self-pay | Admitting: *Deleted

## 2019-12-10 DIAGNOSIS — F322 Major depressive disorder, single episode, severe without psychotic features: Secondary | ICD-10-CM

## 2019-12-10 MED ORDER — ESCITALOPRAM OXALATE 20 MG PO TABS: 20.0000 mg | ORAL_TABLET | Freq: Every day | ORAL | 0 refills | Status: DC

## 2019-12-25 ENCOUNTER — Telehealth: Payer: Self-pay

## 2020-01-10 ENCOUNTER — Ambulatory Visit: Payer: Self-pay | Admitting: Physician Assistant

## 2020-01-10 ENCOUNTER — Encounter: Payer: Self-pay | Admitting: Physician Assistant

## 2020-01-10 ENCOUNTER — Other Ambulatory Visit: Payer: Self-pay

## 2020-01-10 VITALS — BP 100/70 | HR 79 | Temp 97.9°F | Ht 67.75 in | Wt 161.8 lb

## 2020-01-10 DIAGNOSIS — F489 Nonpsychotic mental disorder, unspecified: Secondary | ICD-10-CM

## 2020-01-10 DIAGNOSIS — Z13220 Encounter for screening for lipoid disorders: Secondary | ICD-10-CM

## 2020-01-10 DIAGNOSIS — Z8639 Personal history of other endocrine, nutritional and metabolic disease: Secondary | ICD-10-CM

## 2020-01-10 DIAGNOSIS — Z130 Encounter for screening for diseases of the blood and blood-forming organs and certain disorders involving the immune mechanism: Secondary | ICD-10-CM

## 2020-01-10 DIAGNOSIS — Z7689 Persons encountering health services in other specified circumstances: Secondary | ICD-10-CM

## 2020-01-10 NOTE — Progress Notes (Signed)
BP 100/70   Pulse 79   Temp 97.9 F (36.6 C)   Ht 5' 7.75" (1.721 m)   Wt 161 lb 12 oz (73.4 kg)   SpO2 98%   BMI 24.78 kg/m    Subjective:    Patient ID: Shelly Hill, female    DOB: 01-09-1991, 29 y.o.   MRN: [id number]  HPI: [name] is a 29 y.o. female presenting on 01/10/2020 for New Patient (Initial Visit)   HPI   Pt had a negative covid 19 screening questionnaire.    Pt is a 28yoF who presents today to establish care.  Pt says she Wants labs etc  She had a new patient appointment to establish care with Tereasa Coop on 09/11/19 but pt says she will not continue there because she does not have insurance.    Pt says her Last pap was in 2018 in [place] and thinks she is due for repeat PAP.  She takes zofran for occassional nausea; she thinks it's related to her anxiety.  She doesn't work currently.  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 She has a degree in nutrition and dietetics.  She says she is trying to obtain employment now.  She is active and walks.   Pt has gotten her covid vaccination  She has MH issues that she goes to [facility] for.  She is currently taking wellbutrin, lexapro and ativan for this.    Pt reports no other concerns today.         Relevant past medical, surgical, family and social history reviewed and updated as indicated. Interim medical history since our last visit reviewed. Allergies and medications reviewed and updated.   Current Outpatient Medications:  .  buPROPion (WELLBUTRIN XL) 150 MG 24 hr tablet, Take 150 mg by mouth daily., Disp: , Rfl:  .  drospirenone-ethinyl estradiol (YAZ) 3-0.02 MG tablet, Take 1 tablet by mouth daily., Disp: 1 Package, Rfl: 11 .  escitalopram (LEXAPRO) 20 MG tablet, Take 1 tablet (20 mg total) by mouth daily. (Patient taking differently: Take 15 mg by mouth daily. ), Disp: 90 tablet, Rfl: 0 .  LORazepam (ATIVAN) 0.5 MG tablet, Take 1 tablet (0.5 mg total) by mouth daily as needed  for anxiety., Disp: 30 tablet, Rfl: 0 .  ondansetron (ZOFRAN) 4 MG tablet, Take 1 tablet (4 mg total) by mouth every 8 (eight) hours as needed for nausea or vomiting., Disp: 20 tablet, Rfl: 0     Review of Systems  Per HPI unless specifically indicated above     Objective:    BP 100/70   Pulse 79   Temp 97.9 F (36.6 C)   Ht 5' 7.75" (1.721 m)   Wt 161 lb 12 oz (73.4 kg)   SpO2 98%   BMI 24.78 kg/m   Wt Readings from Last 3 Encounters:  01/10/20 161 lb 12 oz (73.4 kg)  09/11/19 163 lb (73.9 kg)    Physical Exam Vitals reviewed.  Constitutional:      General: She is not in acute distress.    Appearance: Normal appearance. She is well-developed. She is not ill-appearing or toxic-appearing.  HENT:     Head: Normocephalic and atraumatic.  Eyes:     Extraocular Movements: Extraocular movements intact.     Conjunctiva/sclera: Conjunctivae normal.     Pupils: Pupils are equal, round, and reactive to light.  Neck:     Thyroid: No thyromegaly.  Cardiovascular:     Rate and Rhythm: Normal rate  and regular rhythm.     Heart sounds: Normal heart sounds. No murmur heard.   Pulmonary:     Effort: Pulmonary effort is normal.     Breath sounds: Normal breath sounds.  Abdominal:     General: Bowel sounds are normal.     Palpations: Abdomen is soft. There is no hepatomegaly, splenomegaly or mass.     Tenderness: There is no abdominal tenderness. There is no guarding or rebound.  Musculoskeletal:     Cervical back: Neck supple.     Right lower leg: No edema.     Left lower leg: No edema.  Lymphadenopathy:     Cervical: No cervical adenopathy.  Skin:    General: Skin is warm and dry.  Neurological:     General: No focal deficit present.     Mental Status: She is alert and oriented to person, place, and time.     Motor: No weakness or tremor.     Gait: Gait is intact. Gait normal.     Deep Tendon Reflexes:     Reflex Scores:      Patellar reflexes are 2+ on the right side  and 2+ on the left side. Psychiatric:        Attention and Perception: Attention normal.        Speech: Speech normal.        Behavior: Behavior normal. Behavior is cooperative.             Assessment & Plan:    Encounter Diagnoses  Name Primary?  . Encounter to establish care Yes  . Mental health problem   . Screening cholesterol level   . History of vitamin B deficiency   . Screening for iron deficiency anemia      -will get baseline labs -discussed with pt that she should apply for family planning medicaid to cover her OCP.  It would also cover PAP.  Pt says she thinks she applied for that so will have [facility] contact pt to help her with that -discussed with pt that since she is taking 3 MH meds she should continue with [facility] for MH care (as 3 meds indicate need for a MH specialist) -follow up in 1 month to review labs and follow up.  Pt prefers virtual appointment which is scheduled.  She is to contact office prior to that time if needed.

## 2020-01-11 ENCOUNTER — Other Ambulatory Visit (HOSPITAL_COMMUNITY)
Admission: RE | Admit: 2020-01-11 | Discharge: 2020-01-11 | Disposition: A | Discharge status: Home / Self Care | Payer: Self-pay | Source: Ambulatory Visit | Attending: Physician Assistant | Admitting: Physician Assistant

## 2020-01-11 DIAGNOSIS — Z8639 Personal history of other endocrine, nutritional and metabolic disease: Secondary | ICD-10-CM

## 2020-01-11 DIAGNOSIS — Z130 Encounter for screening for diseases of the blood and blood-forming organs and certain disorders involving the immune mechanism: Secondary | ICD-10-CM

## 2020-01-11 DIAGNOSIS — F489 Nonpsychotic mental disorder, unspecified: Secondary | ICD-10-CM

## 2020-01-11 DIAGNOSIS — Z13220 Encounter for screening for lipoid disorders: Secondary | ICD-10-CM

## 2020-01-11 LAB — COMPREHENSIVE METABOLIC PANEL
ALT: 11 U/L (ref 0–44)
AST: 13 U/L — ABNORMAL LOW (ref 15–41)
Albumin: 3.8 g/dL (ref 3.5–5.0)
Alkaline Phosphatase: 49 U/L (ref 38–126)
Anion gap: 10 (ref 5–15)
BUN: 11 mg/dL (ref 6–20)
CO2: 25 mmol/L (ref 22–32)
Calcium: 8.9 mg/dL (ref 8.9–10.3)
Chloride: 104 mmol/L (ref 98–111)
Creatinine, Ser: 0.71 mg/dL (ref 0.44–1.00)
GFR calc Af Amer: >60 mL/min (ref >60)
GFR calc non Af Amer: >60 mL/min (ref >60)
Glucose, Bld: 89 mg/dL (ref 70–99)
Potassium: 4 mmol/L (ref 3.5–5.1)
Sodium: 139 mmol/L (ref 135–145)
Total Bilirubin: 0.6 mg/dL (ref 0.3–1.2)
Total Protein: 7.1 g/dL (ref 6.5–8.1)

## 2020-01-11 LAB — CBC
HCT: 41.2 % (ref 36.0–46.0)
Hemoglobin: 13.9 g/dL (ref 12.0–15.0)
MCH: 31.2 pg (ref 26.0–34.0)
MCHC: 33.7 g/dL (ref 30.0–36.0)
MCV: 92.4 fL (ref 80.0–100.0)
Platelets: 221 10*3/uL (ref 150–400)
RBC: 4.46 MIL/uL (ref 3.87–5.11)
RDW: 11.8 % (ref 11.5–15.5)
WBC: 5.4 10*3/uL (ref 4.0–10.5)
nRBC: 0 % (ref 0.0–0.2)

## 2020-01-11 LAB — LIPID PANEL
Cholesterol: 204 mg/dL — ABNORMAL HIGH (ref 0–200)
HDL: 75 mg/dL (ref >40)
LDL Cholesterol: 118 mg/dL — ABNORMAL HIGH (ref 0–99)
Total CHOL/HDL Ratio: 2.7 RATIO
Triglycerides: 55 mg/dL (ref <150)
VLDL: 11 mg/dL (ref 0–40)

## 2020-01-11 LAB — VITAMIN B12: Vitamin B-12: 560 pg/mL (ref 180–914)

## 2020-01-11 LAB — TSH: TSH: 1.817 u[IU]/mL (ref 0.350–4.500)

## 2020-02-04 ENCOUNTER — Ambulatory Visit
Admission: RE | Admit: 2020-02-04 | Discharge: 2020-02-04 | Disposition: A | Discharge status: Home / Self Care | Payer: Self-pay | Source: Ambulatory Visit

## 2020-02-04 ENCOUNTER — Other Ambulatory Visit: Payer: Self-pay

## 2020-02-04 VITALS — BP 117/82 | HR 91 | Temp 98.6°F | Resp 18

## 2020-02-04 DIAGNOSIS — R197 Diarrhea, unspecified: Secondary | ICD-10-CM | POA: Insufficient documentation

## 2020-02-04 NOTE — ED Provider Notes (Signed)
[facility]   [id number] 02/04/20 Arrival Time: 1442  Chief Complaint  Patient presents with  . Diarrhea     SUBJECTIVE:  Shelly Hill is a 29 y.o. female who presented to the urgent care for complaint of diarrhea for the past week.  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  had 4 liquid stool this morning.  Has tried OTC medications without relief.  Symptoms are made worse with eating.  Denies similar symptoms in the past.  Denies fever, chills, appetite change, weight change, chest pain, nausea, vomiting, changes in bowel or bladder habits.     Patient's last menstrual period was 01/15/2020.    ROS: As per HPI.  All other pertinent ROS negative.      Past Medical History:  Diagnosis Date  . ADHD   . Anemia   . Anxiety   . Depression   . 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 WISDOM TOOTH EXTRACTION Bilateral    No Known Allergies No current facility-administered medications on file prior to encounter.   Current Outpatient Medications on File Prior to Encounter  Medication Sig Dispense Refill  . buPROPion (WELLBUTRIN XL) 150 MG 24 hr tablet Take 150 mg by mouth daily.    . drospirenone-ethinyl estradiol (YAZ) 3-0.02 MG tablet Take 1 tablet by mouth daily. 1 Package 11  . escitalopram (LEXAPRO) 10 MG tablet Take 10 mg by mouth every morning.    . escitalopram (LEXAPRO) 20 MG tablet Take 1 tablet (20 mg total) by mouth daily. (Patient taking differently: Take 15 mg by mouth daily. ) 90 tablet 0  . LORazepam (ATIVAN) 0.5 MG tablet Take 1 tablet (0.5 mg total) by mouth daily as needed for anxiety. 30 tablet 0  . ondansetron (ZOFRAN) 4 MG tablet Take 1 tablet (4 mg total) by mouth every 8 (eight) hours as needed for nausea or vomiting. 20 tablet 0  . [DISCONTINUED] citalopram (CELEXA) 10 MG tablet Take 10 mg by mouth at bedtime.     Social History    Socioeconomic History  . Marital status: Single    Spouse name: Not on file  . Number of children: Not on file  . Years of education: Not on file  . Highest education level: Not on file  Occupational History  . Not on file  Tobacco Use  . Smoking status: Former Smoker    Packs/day: 0.50    Years: 6.00    Pack years: 3.00    Types: Cigarettes    Quit date: 02/27/2019    Years since quitting: 0.9  . Smokeless tobacco: Never Used  Vaping Use  . Vaping Use: Never used  Substance and Sexual Activity  . Alcohol use: No    Comment: occasional   . Drug use: Not Currently    Types: Marijuana  . Sexual activity: Yes    Birth control/protection: Pill, Condom  Other Topics Concern  . Not on file  Social History Narrative  . Not on file   Social Determinants of Health   Financial Resource Strain:   . Difficulty of Paying Living Expenses:   Food Insecurity:   . Worried About Programme researcher, broadcasting/film/video in the Last Year:   . Barista in the Last Year:   Transportation Needs:   . Freight forwarder (Medical):   Marland Kitchen Lack of Transportation (Non-Medical):   Physical Activity:   . Days  of Exercise per Week:   . Minutes of Exercise per Session:   Stress:   . Feeling of Stress :   Social Connections:   . Frequency of Communication with Friends and Family:   . Frequency of Social Gatherings with Friends and Family:   . Attends Religious Services:   . Active Member of Clubs or Organizations:   . Attends Banker Meetings:   Marland Kitchen Marital Status:   Intimate Partner Violence:   . Fear of Current or Ex-Partner:   . Emotionally Abused:   Marland Kitchen Physically Abused:   . Sexually Abused:    Family History  Problem Relation Age of Onset  . Depression Mother   . GER disease Mother   . Depression Father   . Insomnia Father   . Heart disease Maternal Grandmother   . Heart disease Maternal Grandfather   . Cancer Maternal Grandfather        lung cancer  . Diabetes Paternal  Grandmother      OBJECTIVE:  Vitals:   02/04/20 1506  BP: 117/82  Pulse: 91  Resp: 18  Temp: 98.6 F (37 C)  SpO2: 97%    General appearance: Alert; NAD HEENT: NCAT.  Oropharynx clear.  Lungs: clear to auscultation bilaterally without adventitious breath sounds Heart: regular rate and rhythm.  Radial pulses 2+ symmetrical bilaterally Abdomen: soft, non-distended; normal active bowel sounds; non-tender to light and deep palpation; nontender at McBurney's point; negative Murphy's sign; negative rebound; no guarding Back: no CVA tenderness Extremities: no edema; symmetrical with no gross deformities Skin: warm and dry Neurologic: normal gait Psychological: alert and cooperative; normal mood and affect  LABS: No results found for this or any previous visit (from the past 24 hour(s)).  DIAGNOSTIC STUDIES: No results found.   ASSESSMENT & PLAN:  1. Diarrhea of presumed infectious origin     No orders of the defined types were placed in this encounter.   Discharge Instructions Please return stool specimen to our urgent care Increase your fluid intake to replace losses. Clear liquids water, tea, sport drinks, clear flat ginger ale or cola and juices, broth, jello, popsicles  etc.. Avoid milk, greasy foods and anything that doesn't agree with you. If you experience new or worsening symptoms return or go to ER such as fever, chills, nausea, vomiting, diarrhea, bloody or dark tarry stools, constipation, urinary symptoms, worsening abdominal discomfort, symptoms that do not improve with medications, inability to keep fluids down, etc...  Reviewed expectations re: course of current medical issues. Questions answered. Outlined signs and symptoms indicating need for more acute intervention. Patient verbalized understanding. After Visit Summary given.   Durward Parcel, FNP 02/04/20 1533

## 2020-02-04 NOTE — ED Triage Notes (Signed)
Pt presents with c/o diarrhea that first began after eating sushi about 3 weeks ago. Resolved some then worsened this week

## 2020-02-04 NOTE — Discharge Instructions (Addendum)
Please return stool specimen to our urgent care Increase your fluid intake to replace losses. Clear liquids water, tea, sport drinks, clear flat ginger ale or cola and juices, broth, jello, popsicles  etc.. Avoid milk, greasy foods and anything that doesn't agree with you. If you experience new or worsening symptoms return or go to ER such as fever, chills, nausea, vomiting, diarrhea, bloody or dark tarry stools, constipation, urinary symptoms, worsening abdominal discomfort, symptoms that do not improve with medications, inability to keep fluids down, etc..Marland Kitchen

## 2020-02-06 LAB — GASTROINTESTINAL PANEL BY PCR, STOOL (REPLACES STOOL CULTURE)

## 2020-02-11 ENCOUNTER — Ambulatory Visit: Payer: Self-pay | Admitting: Physician Assistant

## 2020-02-11 ENCOUNTER — Encounter: Payer: Self-pay | Admitting: Physician Assistant

## 2020-02-11 DIAGNOSIS — R197 Diarrhea, unspecified: Secondary | ICD-10-CM

## 2020-02-11 DIAGNOSIS — F489 Nonpsychotic mental disorder, unspecified: Secondary | ICD-10-CM

## 2020-02-11 NOTE — Progress Notes (Signed)
LMP 01/15/2020    Subjective:    Patient ID: Shelly Hill, female    DOB: 12-03-1990, 29 y.o.   MRN: [id number]  HPI: Shelly Hill is a 29 y.o. female presenting on 02/11/2020 for No chief complaint on file.   HPI   This is a telemedicine appointment through [employer] due to coronavirus pandemic.  I connected with  Shelly Hill on 02/11/20 by a video enabled telemedicine application and verified that I am speaking with the correct person using two identifiers.   I discussed the limitations of evaluation and management by telemedicine. The patient expressed understanding and agreed to proceed.  Pt is at home.  Provider is at office.   Pt is 28yoF with appointment today to review labs.  She is not going to [facility] currently.  She is doing okay with out it.  She is seeing a psychiatrist for MH meds and says that is going well.  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  Pt has had some GI issues for about 1 month.  She went to urgent care.  Her stool studies were normal   She is using some imodium and psilium husk for fiber.         Relevant past medical, surgical, family and social history reviewed and updated as indicated. Interim medical history since our last visit reviewed. Allergies and medications reviewed and updated.   Current Outpatient Medications:    amphetamine-dextroamphetamine (ADDERALL) 10 MG tablet, Take 10 mg by mouth daily with breakfast., Disp: , Rfl:    buPROPion (WELLBUTRIN XL) 150 MG 24 hr tablet, Take 150 mg by mouth daily., Disp: , Rfl:    drospirenone-ethinyl estradiol (YAZ) 3-0.02 MG tablet, Take 1 tablet by mouth daily., Disp: 1 Package, Rfl: 11   escitalopram (LEXAPRO) 20 MG tablet, Take 1 tablet (20 mg total) by mouth daily. (Patient taking differently: Take 15 mg by mouth daily. ), Disp: 90 tablet, Rfl: 0   LORazepam (ATIVAN) 0.5 MG tablet, Take 1 tablet (0.5 mg total) by mouth daily as needed for anxiety., Disp:  30 tablet, Rfl: 0   ondansetron (ZOFRAN) 4 MG tablet, Take 1 tablet (4 mg total) by mouth every 8 (eight) hours as needed for nausea or vomiting., Disp: 20 tablet, Rfl: 0    Review of Systems  Per HPI unless specifically indicated above     Objective:    LMP 01/15/2020   Wt Readings from Last 3 Encounters:  01/10/20 161 lb 12 oz (73.4 kg)  09/11/19 163 lb (73.9 kg)    Physical Exam Constitutional:      General: She is not in acute distress.    Appearance: She is not ill-appearing.  HENT:     Head: Normocephalic and atraumatic.  Pulmonary:     Effort: No respiratory distress.  Neurological:     Mental Status: She is alert and oriented to person, place, and time.  Psychiatric:        Attention and Perception: Attention normal.        Speech: Speech normal.        Behavior: Behavior normal.             Assessment & Plan:    Encounter Diagnoses  Name Primary?   Mental health problem Yes   Diarrhea, unspecified type      -Reviewed labs with pt -counseled on lowfat diet and exercise for lipids.  Medications not indicated -Pt to contact office if GI problems persist -  pt to continue with her psychiatrist for MH issues -Otherwise she will follow up  1 year

## 2020-02-14 ENCOUNTER — Other Ambulatory Visit: Payer: Self-pay

## 2020-02-14 DIAGNOSIS — Z20822 Contact with and (suspected) exposure to covid-19: Secondary | ICD-10-CM

## 2020-02-15 LAB — SARS-COV-2, NAA 2 DAY TAT

## 2020-02-15 LAB — NOVEL CORONAVIRUS, NAA: SARS-CoV-2, NAA: NOT DETECTED

## 2020-03-28 ENCOUNTER — Encounter (HOSPITAL_COMMUNITY): Payer: Self-pay | Admitting: Ophthalmology

## 2020-03-28 ENCOUNTER — Ambulatory Visit (HOSPITAL_COMMUNITY)
Admission: RE | Admit: 2020-03-28 | Discharge: 2020-03-28 | Disposition: A | Discharge status: Home / Self Care | Payer: 59 | Source: Other Acute Inpatient Hospital | Attending: Ophthalmology | Admitting: Ophthalmology

## 2020-03-28 ENCOUNTER — Other Ambulatory Visit: Payer: Self-pay

## 2020-03-28 ENCOUNTER — Ambulatory Visit (HOSPITAL_COMMUNITY): Payer: 59 | Admitting: Certified Registered Nurse Anesthetist

## 2020-03-28 ENCOUNTER — Encounter (HOSPITAL_COMMUNITY): Admission: RE | Disposition: A | Discharge status: Home / Self Care | Payer: Self-pay | Attending: Ophthalmology

## 2020-03-28 DIAGNOSIS — H3321 Serous retinal detachment, right eye: Secondary | ICD-10-CM | POA: Insufficient documentation

## 2020-03-28 DIAGNOSIS — Z87891 Personal history of nicotine dependence: Secondary | ICD-10-CM | POA: Insufficient documentation

## 2020-03-28 DIAGNOSIS — Z20822 Contact with and (suspected) exposure to covid-19: Secondary | ICD-10-CM | POA: Diagnosis not present

## 2020-03-28 HISTORY — PX: GAS/FLUID EXCHANGE: SHX5334

## 2020-03-28 HISTORY — PX: PERFLUORONE INJECTION: SHX5302

## 2020-03-28 HISTORY — PX: PHOTOCOAGULATION WITH LASER: SHX6027

## 2020-03-28 HISTORY — PX: REPAIR OF COMPLEX TRACTION RETINAL DETACHMENT: SHX6217

## 2020-03-28 HISTORY — PX: PARS PLANA VITRECTOMY: SHX2166

## 2020-03-28 LAB — CBC
HCT: 42.2 % (ref 36.0–46.0)
Hemoglobin: 14.3 g/dL (ref 12.0–15.0)
MCH: 30.5 pg (ref 26.0–34.0)
MCHC: 33.9 g/dL (ref 30.0–36.0)
MCV: 90 fL (ref 80.0–100.0)
Platelets: 231 10*3/uL (ref 150–400)
RBC: 4.69 MIL/uL (ref 3.87–5.11)
RDW: 11.9 % (ref 11.5–15.5)
WBC: 6.2 10*3/uL (ref 4.0–10.5)
nRBC: 0 % (ref 0.0–0.2)

## 2020-03-28 LAB — POCT PREGNANCY, URINE: Preg Test, Ur: NEGATIVE

## 2020-03-28 LAB — SARS CORONAVIRUS 2 BY RT PCR (HOSPITAL ORDER, PERFORMED IN ~~LOC~~ HOSPITAL LAB): SARS Coronavirus 2: NEGATIVE

## 2020-03-28 SURGERY — REPAIR, RETINAL DETACHMENT, COMPLEX
Anesthesia: General | Site: Eye | Laterality: Right

## 2020-03-28 MED ORDER — ORAL CARE MOUTH RINSE: 15.0000 mL | Freq: Once | OROMUCOSAL | Status: AC

## 2020-03-28 MED ORDER — LIDOCAINE HCL 2 % IJ SOLN
INTRAMUSCULAR | Status: AC
  Filled 2020-03-28: qty 20

## 2020-03-28 MED ORDER — OXYCODONE HCL 5 MG/5ML PO SOLN: 5.0000 mg | Freq: Once | ORAL | Status: DC | PRN

## 2020-03-28 MED ORDER — OFLOXACIN 0.3 % OP SOLN
1.0000 [drp] | OPHTHALMIC | Status: AC | PRN
  Administered 2020-03-28 (×3): 1 [drp] via OPHTHALMIC
  Filled 2020-03-28: qty 5

## 2020-03-28 MED ORDER — PROPOFOL 10 MG/ML IV BOLUS
INTRAVENOUS | Status: DC | PRN
  Administered 2020-03-28: 160 mg via INTRAVENOUS

## 2020-03-28 MED ORDER — DEXAMETHASONE SODIUM PHOSPHATE 10 MG/ML IJ SOLN
INTRAMUSCULAR | Status: DC | PRN
  Administered 2020-03-28: 10 mg via INTRAVENOUS

## 2020-03-28 MED ORDER — PROMETHAZINE HCL 25 MG/ML IJ SOLN
INTRAMUSCULAR | Status: AC
  Filled 2020-03-28: qty 1

## 2020-03-28 MED ORDER — SODIUM CHLORIDE 0.9 % IV SOLN: INTRAVENOUS | Status: DC | PRN

## 2020-03-28 MED ORDER — CEFAZOLIN SUBCONJUNCTIVAL INJECTION 100 MG/0.5 ML
100.0000 mg | INJECTION | SUBCONJUNCTIVAL | Status: DC
  Filled 2020-03-28: qty 5

## 2020-03-28 MED ORDER — TOBRAMYCIN-DEXAMETHASONE 0.3-0.1 % OP OINT
TOPICAL_OINTMENT | OPHTHALMIC | Status: AC
  Filled 2020-03-28: qty 3.5

## 2020-03-28 MED ORDER — HYPROMELLOSE (GONIOSCOPIC) 2.5 % OP SOLN
OPHTHALMIC | Status: AC
  Filled 2020-03-28: qty 15

## 2020-03-28 MED ORDER — ROCURONIUM BROMIDE 10 MG/ML (PF) SYRINGE
PREFILLED_SYRINGE | INTRAVENOUS | Status: DC | PRN
  Administered 2020-03-28: 50 mg via INTRAVENOUS

## 2020-03-28 MED ORDER — TRIAMCINOLONE ACETONIDE 40 MG/ML IJ SUSP
INTRAMUSCULAR | Status: AC
  Filled 2020-03-28: qty 5

## 2020-03-28 MED ORDER — BSS PLUS IO SOLN
INTRAOCULAR | Status: AC
  Filled 2020-03-28: qty 500

## 2020-03-28 MED ORDER — EPINEPHRINE PF 1 MG/ML IJ SOLN: INTRAOCULAR | Status: DC | PRN

## 2020-03-28 MED ORDER — TOBRAMYCIN-DEXAMETHASONE 0.3-0.1 % OP OINT
TOPICAL_OINTMENT | OPHTHALMIC | Status: DC | PRN
  Administered 2020-03-28: 1 via OPHTHALMIC

## 2020-03-28 MED ORDER — 0.9 % SODIUM CHLORIDE (POUR BTL) OPTIME
TOPICAL | Status: DC | PRN
  Administered 2020-03-28: 1000 mL

## 2020-03-28 MED ORDER — DEXAMETHASONE SODIUM PHOSPHATE 10 MG/ML IJ SOLN
INTRAMUSCULAR | Status: AC
  Filled 2020-03-28: qty 1

## 2020-03-28 MED ORDER — ONDANSETRON HCL 4 MG/2ML IJ SOLN
INTRAMUSCULAR | Status: DC | PRN
  Administered 2020-03-28: 4 mg via INTRAVENOUS

## 2020-03-28 MED ORDER — LIDOCAINE 2% (20 MG/ML) 5 ML SYRINGE
INTRAMUSCULAR | Status: DC | PRN
  Administered 2020-03-28: 60 mg via INTRAVENOUS

## 2020-03-28 MED ORDER — SUGAMMADEX SODIUM 200 MG/2ML IV SOLN
INTRAVENOUS | Status: DC | PRN
  Administered 2020-03-28: 150 mg via INTRAVENOUS

## 2020-03-28 MED ORDER — HYALURONIDASE HUMAN 150 UNIT/ML IJ SOLN
INTRAMUSCULAR | Status: AC
  Filled 2020-03-28: qty 1

## 2020-03-28 MED ORDER — CHLORHEXIDINE GLUCONATE 0.12 % MT SOLN
15.0000 mL | Freq: Once | OROMUCOSAL | Status: AC
  Administered 2020-03-28: 15 mL via OROMUCOSAL
  Filled 2020-03-28: qty 15

## 2020-03-28 MED ORDER — PROPARACAINE HCL 0.5 % OP SOLN
1.0000 [drp] | OPHTHALMIC | Status: AC | PRN
  Administered 2020-03-28 (×3): 1 [drp] via OPHTHALMIC
  Filled 2020-03-28: qty 15

## 2020-03-28 MED ORDER — MIDAZOLAM HCL 2 MG/2ML IJ SOLN
INTRAMUSCULAR | Status: AC
  Filled 2020-03-28: qty 2

## 2020-03-28 MED ORDER — ONDANSETRON HCL 4 MG/2ML IJ SOLN
INTRAMUSCULAR | Status: AC
  Filled 2020-03-28: qty 2

## 2020-03-28 MED ORDER — PROMETHAZINE HCL 25 MG/ML IJ SOLN
6.2500 mg | INTRAMUSCULAR | Status: DC | PRN
  Administered 2020-03-28: 6.25 mg via INTRAVENOUS

## 2020-03-28 MED ORDER — ATROPINE SULFATE 1 % OP SOLN
OPHTHALMIC | Status: AC
  Filled 2020-03-28: qty 5

## 2020-03-28 MED ORDER — LIDOCAINE HCL 2 % IJ SOLN: INTRAMUSCULAR | Status: DC | PRN

## 2020-03-28 MED ORDER — MIDAZOLAM HCL 2 MG/2ML IJ SOLN
INTRAMUSCULAR | Status: DC | PRN
  Administered 2020-03-28: 2 mg via INTRAVENOUS

## 2020-03-28 MED ORDER — DEXMEDETOMIDINE (PRECEDEX) IN NS 20 MCG/5ML (4 MCG/ML) IV SYRINGE
PREFILLED_SYRINGE | INTRAVENOUS | Status: DC | PRN
  Administered 2020-03-28: 10 ug via INTRAVENOUS

## 2020-03-28 MED ORDER — EPINEPHRINE PF 1 MG/ML IJ SOLN
INTRAMUSCULAR | Status: AC
  Filled 2020-03-28: qty 1

## 2020-03-28 MED ORDER — HYDROMORPHONE HCL 1 MG/ML IJ SOLN: 0.2500 mg | INTRAMUSCULAR | Status: DC | PRN

## 2020-03-28 MED ORDER — FENTANYL CITRATE (PF) 250 MCG/5ML IJ SOLN
INTRAMUSCULAR | Status: AC
  Filled 2020-03-28: qty 5

## 2020-03-28 MED ORDER — PHENYLEPHRINE HCL 2.5 % OP SOLN
1.0000 [drp] | OPHTHALMIC | Status: AC | PRN
  Administered 2020-03-28 (×3): 1 [drp] via OPHTHALMIC
  Filled 2020-03-28: qty 2

## 2020-03-28 MED ORDER — LACTATED RINGERS IV SOLN: INTRAVENOUS | Status: DC

## 2020-03-28 MED ORDER — CEFAZOLIN SUBCONJUNCTIVAL INJECTION 100 MG/0.5 ML
INJECTION | SUBCONJUNCTIVAL | Status: DC | PRN
  Administered 2020-03-28: 50 mg via SUBCONJUNCTIVAL

## 2020-03-28 MED ORDER — TRIAMCINOLONE ACETONIDE 40 MG/ML IJ SUSP
INTRAMUSCULAR | Status: DC | PRN
  Administered 2020-03-28: 40 mg

## 2020-03-28 MED ORDER — OXYCODONE HCL 5 MG PO TABS: 5.0000 mg | ORAL_TABLET | Freq: Once | ORAL | Status: DC | PRN

## 2020-03-28 MED ORDER — LIDOCAINE 2% (20 MG/ML) 5 ML SYRINGE
INTRAMUSCULAR | Status: AC
  Filled 2020-03-28: qty 5

## 2020-03-28 MED ORDER — BSS IO SOLN
INTRAOCULAR | Status: AC
  Filled 2020-03-28: qty 15

## 2020-03-28 MED ORDER — HYPROMELLOSE (GONIOSCOPIC) 2.5 % OP SOLN
OPHTHALMIC | Status: DC | PRN
  Administered 2020-03-28: 2 [drp] via OPHTHALMIC

## 2020-03-28 MED ORDER — FENTANYL CITRATE (PF) 100 MCG/2ML IJ SOLN
INTRAMUSCULAR | Status: DC | PRN
Start: 2020-03-28 — End: 2020-03-28
  Administered 2020-03-28: 25 ug via INTRAVENOUS
  Administered 2020-03-28 (×3): 50 ug via INTRAVENOUS
  Administered 2020-03-28 (×3): 25 ug via INTRAVENOUS

## 2020-03-28 MED ORDER — CYCLOPENTOLATE HCL 1 % OP SOLN
1.0000 [drp] | OPHTHALMIC | Status: AC | PRN
  Administered 2020-03-28 (×3): 1 [drp] via OPHTHALMIC
  Filled 2020-03-28: qty 2

## 2020-03-28 MED ORDER — STERILE WATER FOR IRRIGATION IR SOLN
Status: DC | PRN
  Administered 2020-03-28: 300 mL

## 2020-03-28 MED ORDER — BUPIVACAINE HCL (PF) 0.75 % IJ SOLN
INTRAMUSCULAR | Status: AC
  Filled 2020-03-28: qty 10

## 2020-03-28 SURGICAL SUPPLY — 51 items
APPLICATOR COTTON TIP 6 STRL (MISCELLANEOUS) ×1 IMPLANT
APPLICATOR COTTON TIP 6IN STRL (MISCELLANEOUS) ×3
BAND WRIST GAS GREEN (MISCELLANEOUS) IMPLANT
CABLE BIPOLOR RESECTION CORD (MISCELLANEOUS) ×3 IMPLANT
CANNULA DUAL BORE 23G (CANNULA) IMPLANT
CANNULA DUALBORE 25G (CANNULA) ×3 IMPLANT
CANNULA VLV SOFT TIP 25GA (OPHTHALMIC) ×3 IMPLANT
CAUTERY EYE LOW TEMP 1300F FIN (OPHTHALMIC RELATED) IMPLANT
CLOSURE STERI-STRIP 1/2X4 (GAUZE/BANDAGES/DRESSINGS) ×1
CLSR STERI-STRIP ANTIMIC 1/2X4 (GAUZE/BANDAGES/DRESSINGS) ×2 IMPLANT
COVER MAYO STAND STRL (DRAPES) IMPLANT
DRAPE HALF SHEET 40X57 (DRAPES) ×3 IMPLANT
DRAPE INCISE 51X51 W/FILM STRL (DRAPES) IMPLANT
DRAPE RETRACTOR (MISCELLANEOUS) ×3 IMPLANT
ERASER HMR WETFIELD 23G BP (MISCELLANEOUS) IMPLANT
FORCEPS GRIESHABER ILM 25G A (INSTRUMENTS) IMPLANT
GAS WRIST BAND GREEN (MISCELLANEOUS)
GLOVE SURG SYN 7.5  E (GLOVE) ×2
GLOVE SURG SYN 7.5 E (GLOVE) ×1 IMPLANT
GOWN STRL REUS W/ TWL LRG LVL3 (GOWN DISPOSABLE) ×2 IMPLANT
GOWN STRL REUS W/TWL LRG LVL3 (GOWN DISPOSABLE) ×4
KIT BASIN OR (CUSTOM PROCEDURE TRAY) ×3 IMPLANT
KIT PERFLUORON PROCEDURE 5ML (MISCELLANEOUS) ×3 IMPLANT
KIT TURNOVER KIT B (KITS) ×3 IMPLANT
LENS BIOM SUPER VIEW SET DISP (MISCELLANEOUS) ×3 IMPLANT
MICROPICK 25G (MISCELLANEOUS)
NEEDLE 18GX1X1/2 (RX/OR ONLY) (NEEDLE) ×3 IMPLANT
NEEDLE 25GX 5/8IN NON SAFETY (NEEDLE) ×3 IMPLANT
NEEDLE FILTER BLUNT 18X 1/2SAF (NEEDLE) ×2
NEEDLE FILTER BLUNT 18X1 1/2 (NEEDLE) ×1 IMPLANT
NEEDLE HYPO 25GX1X1/2 BEV (NEEDLE) IMPLANT
NEEDLE HYPO 30X.5 LL (NEEDLE) ×6 IMPLANT
NEEDLE RETROBULBAR 25GX1.5 (NEEDLE) ×3 IMPLANT
NS IRRIG 1000ML POUR BTL (IV SOLUTION) ×3 IMPLANT
PACK VITRECTOMY CUSTOM (CUSTOM PROCEDURE TRAY) ×3 IMPLANT
PAD ARMBOARD 7.5X6 YLW CONV (MISCELLANEOUS) ×6 IMPLANT
PAK PIK VITRECTOMY CVS 25GA (OPHTHALMIC) ×3 IMPLANT
PICK MICROPICK 25G (MISCELLANEOUS) IMPLANT
PROBE ENDO DIATHERMY 25G (MISCELLANEOUS) ×3 IMPLANT
PROBE LASER ILLUM FLEX CVD 25G (OPHTHALMIC) ×3 IMPLANT
ROLLS DENTAL (MISCELLANEOUS) IMPLANT
SOL ANTI FOG 6CC (MISCELLANEOUS) ×1 IMPLANT
SOLUTION ANTI FOG 6CC (MISCELLANEOUS) ×2
SUT VICRYL 7 0 TG140 8 (SUTURE) IMPLANT
SUT VICRYL 8 0 TG140 8 (SUTURE) IMPLANT
SYR 10ML LL (SYRINGE) ×3 IMPLANT
SYR 20ML LL LF (SYRINGE) ×3 IMPLANT
SYR 5ML LL (SYRINGE) ×3 IMPLANT
SYR TB 1ML LUER SLIP (SYRINGE) ×3 IMPLANT
WATER STERILE IRR 1000ML POUR (IV SOLUTION) ×3 IMPLANT
WIPE INSTRUMENT VISIWIPE 73X73 (MISCELLANEOUS) IMPLANT

## 2020-03-28 NOTE — Anesthesia Procedure Notes (Signed)
Procedure Name: Intubation Date/Time: 03/28/2020 4:36 PM Performed by: Clearnce Sorrel, CRNA Pre-anesthesia Checklist: Patient identified, Emergency Drugs available, Suction available, Patient being monitored and Timeout performed Patient Re-evaluated:Patient Re-evaluated prior to induction Oxygen Delivery Method: Circle system utilized Preoxygenation: Pre-oxygenation with 100% oxygen Induction Type: IV induction Ventilation: Mask ventilation without difficulty Laryngoscope Size: Mac and 3 Grade View: Grade I Tube type: Oral Tube size: 7.0 mm Number of attempts: 1 Airway Equipment and Method: Stylet Placement Confirmation: ETT inserted through vocal cords under direct vision,  breath sounds checked- equal and bilateral and positive ETCO2 Secured at: 23 cm Tube secured with: Tape Dental Injury: Teeth and Oropharynx as per pre-operative assessment

## 2020-03-28 NOTE — OR Nursing (Signed)
[employer] bracelet applied to right wrist

## 2020-03-28 NOTE — H&P (Signed)
Date of examination:  03/28/20  Indication for surgery: retinal detachment right eye  Pertinent past medical history:  Past Medical History:  Diagnosis Date  . ADHD   . Anemia   . Anxiety   . Depression   . 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 Depression Mother   . GER disease Mother   . Depression Father   . Insomnia Father   . Heart disease Maternal Grandmother   . Heart disease Maternal Grandfather   . Cancer Maternal Grandfather        lung cancer  . Diabetes Paternal Grandmother     General:  Healthy appearing patient in no distress.    Eyes:    Acuity OD 20/100  External: Within normal limits     Anterior segment: Within normal limits      Fundus: inferotemporal retinal detachment rigth eye      Impression: Retinal detachment right eye   Plan: Retinal detachment repair right eye   Carmela Rima, MD

## 2020-03-28 NOTE — Transfer of Care (Signed)
Immediate Anesthesia Transfer of Care Note  Patient: Shelly Hill  Procedure(s) Performed: REPAIR OF COMPLEX TRACTION RETINAL DETACHMENT RIGHT EYE (Right Eye) PARS PLANA VITRECTOMY WITH 25 GAUGE (Right Eye) PHOTOCOAGULATION WITH LASER (Right Eye) PERFLUORONE INJECTION (Right Eye) GAS/FLUID EXCHANGE (Right Eye)  Patient Location: PACU  Anesthesia Type:General  Level of Consciousness: drowsy and patient cooperative  Airway & Oxygen Therapy: Patient Spontanous Breathing  Post-op Assessment: Report given to RN and Post -op Vital signs reviewed and stable  Post vital signs: Reviewed and stable  Last Vitals:  Vitals Value Taken Time  BP 130/82 03/28/20 1855  Temp    Pulse 100 03/28/20 1857  Resp 10 03/28/20 1857  SpO2 94 % 03/28/20 1857  Vitals shown include unvalidated device data.  Last Pain:  Vitals:   03/28/20 1401  TempSrc:   PainSc: 0-No pain         Complications: No complications documented.

## 2020-03-28 NOTE — Anesthesia Preprocedure Evaluation (Addendum)

## 2020-03-28 NOTE — Anesthesia Postprocedure Evaluation (Signed)
Anesthesia Post Note  Patient: Shelly Hill  Procedure(s) Performed: REPAIR OF COMPLEX TRACTION RETINAL DETACHMENT RIGHT EYE (Right Eye) PARS PLANA VITRECTOMY WITH 25 GAUGE (Right Eye) PHOTOCOAGULATION WITH LASER (Right Eye) PERFLUORONE INJECTION (Right Eye) GAS/FLUID EXCHANGE (Right Eye)     Patient location during evaluation: PACU Anesthesia Type: General Level of consciousness: awake and alert Pain management: pain level controlled Vital Signs Assessment: post-procedure vital signs reviewed and stable Respiratory status: spontaneous breathing, nonlabored ventilation, respiratory function stable and patient connected to nasal cannula oxygen Cardiovascular status: blood pressure returned to baseline and stable Postop Assessment: no apparent nausea or vomiting Anesthetic complications: no   No complications documented.  Last Vitals:  Vitals:   03/28/20 1925 03/28/20 1940  BP: 115/77 117/82  Pulse: 86 86  Resp: 12   Temp:  (!) 36.4 C  SpO2: 99% 98%    Last Pain:  Vitals:   03/28/20 1940  TempSrc:   PainSc: 1                  Jaiden Dinkins P Ahmet Schank

## 2020-03-28 NOTE — Brief Op Note (Signed)
03/28/2020  6:45 PM  PATIENT:  Shelly Hill  29 y.o. female  PRE-OPERATIVE DIAGNOSIS:  Right eye retinal detachment  POST-OPERATIVE DIAGNOSIS:  Right eye retinal detachment  PROCEDURE:  Procedure(s): REPAIR OF RETINAL DETACHMENT RIGHT EYE (Right) PARS PLANA VITRECTOMY WITH 25 GAUGE (Right) PHOTOCOAGULATION WITH LASER (Right) PERFLUORONE INJECTION (Right) [id number] Gas tamponade (Right)  SURGEON:  Surgeon(s) and Role:    Carmela Rima, MD - Primary  PHYSICIAN ASSISTANT:   ASSISTANTS: none   ANESTHESIA:   local and general  EBL:  minimal   BLOOD ADMINISTERED:none  DRAINS: none   LOCAL MEDICATIONS USED:  MARCAINE    and LIDOCAINE   SPECIMEN:  No Specimen  DISPOSITION OF SPECIMEN:  N/A  COUNTS:  YES  TOURNIQUET:  * No tourniquets in log *  DICTATION: .Note written in EPIC  PLAN OF CARE: Discharge to home after PACU  PATIENT DISPOSITION:  PACU - hemodynamically stable.   Delay start of Pharmacological VTE agent (>24hrs) due to surgical blood loss or risk of bleeding: not applicable

## 2020-03-28 NOTE — Progress Notes (Signed)
Pacu Discharge Note  Patient instuctions were given to family. Wound care, diet, pain, follow up care and how and whom to contact with concerns were discussed. Family aware someone needs to remain with patient overnight and concerns after receiving anesthesia and what to avoid and safety. Answered all questions and concerns.    Pt has a follow up appointment tomorrow 10/2 with Dr Allena Katz.   Discharge paperwork has clear contact informations for surgeon and 24 hour RN line for concerns.   Discussed what concerns to look for including infection and signs/symptoms to look for.   IV was removed prior to discharge. Patient was brought to car with belongings.   Pt exits my care.

## 2020-03-28 NOTE — Discharge Instructions (Addendum)
DO NOT SLEEP ON BACK, THE EYE PRESSURE CAN GO UP AND CAUSE VISION LOSS   SLEEP ON LEFT SIDE WITH NOSE TO PILLOW  DURING DAY KEEP FACE DOWN WITH HEAD TILTED SLIGHTLY TO THE LEFT.  15 MINUTES EVERY 2 HOURS IT IS OK TO LOOK STRAIGHT AHEAD (USE BATHROOM, EAT, WALK, ETC.)  General Anesthesia, Adult, Care After This sheet gives you information about how to care for yourself after your procedure. Your health care provider may also give you more specific instructions. If you have problems or questions, contact your health care provider. What can I expect after the procedure? After the procedure, the following side effects are common:  Pain or discomfort at the IV site.  Nausea.  Vomiting.  Sore throat.  Trouble concentrating.  Feeling cold or chills.  Weak or tired.  Sleepiness and fatigue.  Soreness and body aches. These side effects can affect parts of the body that were not involved in surgery. Follow these instructions at home:  For at least 24 hours after the procedure:  Have a responsible adult stay with you. It is important to have someone help care for you until you are awake and alert.  Rest as needed.  Do not: ? Participate in activities in which you could fall or become injured. ? Drive. ? Use heavy machinery. ? Drink alcohol. ? Take sleeping pills or medicines that cause drowsiness. ? Make important decisions or sign legal documents. ? Take care of children on your own. Eating and drinking  Follow any instructions from your health care provider about eating or drinking restrictions.  When you feel hungry, start by eating small amounts of foods that are soft and easy to digest (bland), such as toast. Gradually return to your regular diet.  Drink enough fluid to keep your urine pale yellow.  If you vomit, rehydrate by drinking water, juice, or clear broth. General instructions  If you have sleep apnea, surgery and certain medicines can increase your risk for  breathing problems. Follow instructions from your health care provider about wearing your sleep device: ? Anytime you are sleeping, including during daytime naps. ? While taking prescription pain medicines, sleeping medicines, or medicines that make you drowsy.  Return to your normal activities as told by your health care provider. Ask your health care provider what activities are safe for you.  Take over-the-counter and prescription medicines only as told by your health care provider.  If you smoke, do not smoke without supervision.  Keep all follow-up visits as told by your health care provider. This is important. Contact a health care provider if:  You have nausea or vomiting that does not get better with medicine.  You cannot eat or drink without vomiting.  You have pain that does not get better with medicine.  You are unable to pass urine.  You develop a skin rash.  You have a fever.  You have redness around your IV site that gets worse. Get help right away if:  You have difficulty breathing.  You have chest pain.  You have blood in your urine or stool, or you vomit blood. Summary  After the procedure, it is common to have a sore throat or nausea. It is also common to feel tired.  Have a responsible adult stay with you for the first 24 hours after general anesthesia. It is important to have someone help care for you until you are awake and alert.  When you feel hungry, start by eating small amounts  of foods that are soft and easy to digest (bland), such as toast. Gradually return to your regular diet.  Drink enough fluid to keep your urine pale yellow.  Return to your normal activities as told by your health care provider. Ask your health care provider what activities are safe for you. This information is not intended to replace advice given to you by your health care provider. Make sure you discuss any questions you have with your health care provider. Document  Revised: 06/17/2017 Document Reviewed: 01/28/2017 Elsevier Patient Education  2020 [employer].

## 2020-03-29 ENCOUNTER — Encounter (HOSPITAL_COMMUNITY): Payer: Self-pay | Admitting: Ophthalmology

## 2020-04-14 ENCOUNTER — Inpatient Hospital Stay (HOSPITAL_COMMUNITY): Payer: 59 | Admitting: Anesthesiology

## 2020-04-14 ENCOUNTER — Ambulatory Visit (HOSPITAL_COMMUNITY)
Admission: RE | Admit: 2020-04-14 | Discharge: 2020-04-14 | Disposition: A | Discharge status: Home / Self Care | Payer: 59 | Attending: Ophthalmology | Admitting: Ophthalmology

## 2020-04-14 ENCOUNTER — Encounter (HOSPITAL_COMMUNITY)
Admission: RE | Disposition: A | Discharge status: Home / Self Care | Payer: Self-pay | Source: Home / Self Care | Attending: Ophthalmology

## 2020-04-14 ENCOUNTER — Other Ambulatory Visit: Payer: Self-pay

## 2020-04-14 ENCOUNTER — Encounter (HOSPITAL_COMMUNITY): Payer: Self-pay | Admitting: Ophthalmology

## 2020-04-14 DIAGNOSIS — H3321 Serous retinal detachment, right eye: Secondary | ICD-10-CM | POA: Diagnosis not present

## 2020-04-14 DIAGNOSIS — Z87891 Personal history of nicotine dependence: Secondary | ICD-10-CM | POA: Diagnosis not present

## 2020-04-14 DIAGNOSIS — Z20822 Contact with and (suspected) exposure to covid-19: Secondary | ICD-10-CM | POA: Diagnosis not present

## 2020-04-14 HISTORY — PX: PHOTOCOAGULATION WITH LASER: SHX6027

## 2020-04-14 HISTORY — PX: SCLERAL BUCKLE: SHX5340

## 2020-04-14 HISTORY — PX: PARS PLANA VITRECTOMY: SHX2166

## 2020-04-14 HISTORY — PX: INJECTION OF SILICONE OIL: SHX6422

## 2020-04-14 LAB — CBC
HCT: 41.1 % (ref 36.0–46.0)
Hemoglobin: 13.8 g/dL (ref 12.0–15.0)
MCH: 31.2 pg (ref 26.0–34.0)
MCHC: 33.6 g/dL (ref 30.0–36.0)
MCV: 92.8 fL (ref 80.0–100.0)
Platelets: 202 10*3/uL (ref 150–400)
RBC: 4.43 MIL/uL (ref 3.87–5.11)
RDW: 11.8 % (ref 11.5–15.5)
WBC: 5.4 10*3/uL (ref 4.0–10.5)
nRBC: 0 % (ref 0.0–0.2)

## 2020-04-14 LAB — COMPREHENSIVE METABOLIC PANEL
ALT: 12 U/L (ref 0–44)
AST: 15 U/L (ref 15–41)
Albumin: 3.6 g/dL (ref 3.5–5.0)
Alkaline Phosphatase: 41 U/L (ref 38–126)
Anion gap: 9 (ref 5–15)
BUN: 10 mg/dL (ref 6–20)
CO2: 25 mmol/L (ref 22–32)
Calcium: 8.8 mg/dL — ABNORMAL LOW (ref 8.9–10.3)
Chloride: 104 mmol/L (ref 98–111)
Creatinine, Ser: 0.8 mg/dL (ref 0.44–1.00)
GFR, Estimated: >60 mL/min (ref >60)
Glucose, Bld: 88 mg/dL (ref 70–99)
Potassium: 3.7 mmol/L (ref 3.5–5.1)
Sodium: 138 mmol/L (ref 135–145)
Total Bilirubin: 0.7 mg/dL (ref 0.3–1.2)
Total Protein: 6 g/dL — ABNORMAL LOW (ref 6.5–8.1)

## 2020-04-14 LAB — POCT PREGNANCY, URINE: Preg Test, Ur: NEGATIVE

## 2020-04-14 LAB — SARS CORONAVIRUS 2 BY RT PCR (HOSPITAL ORDER, PERFORMED IN ~~LOC~~ HOSPITAL LAB): SARS Coronavirus 2: NEGATIVE

## 2020-04-14 SURGERY — SCLERAL BUCKLE
Anesthesia: General | Site: Eye | Laterality: Right

## 2020-04-14 MED ORDER — KETOROLAC TROMETHAMINE 15 MG/ML IJ SOLN
INTRAMUSCULAR | Status: DC | PRN
  Administered 2020-04-14: 15 mg via INTRAVENOUS

## 2020-04-14 MED ORDER — BSS PLUS IO SOLN
INTRAOCULAR | Status: AC
  Filled 2020-04-14: qty 500

## 2020-04-14 MED ORDER — PROPOFOL 10 MG/ML IV BOLUS
INTRAVENOUS | Status: DC | PRN
  Administered 2020-04-14: 200 mg via INTRAVENOUS

## 2020-04-14 MED ORDER — PHENYLEPHRINE HCL 2.5 % OP SOLN
1.0000 [drp] | OPHTHALMIC | Status: AC | PRN
  Administered 2020-04-14 (×3): 1 [drp] via OPHTHALMIC
  Filled 2020-04-14: qty 2

## 2020-04-14 MED ORDER — LIDOCAINE HCL 2 % IJ SOLN
INTRAMUSCULAR | Status: AC
  Filled 2020-04-14: qty 20

## 2020-04-14 MED ORDER — ACETAMINOPHEN 10 MG/ML IV SOLN
INTRAVENOUS | Status: DC | PRN
  Administered 2020-04-14: 1000 mg via INTRAVENOUS

## 2020-04-14 MED ORDER — OXYCODONE HCL 5 MG PO TABS: 5.0000 mg | ORAL_TABLET | Freq: Once | ORAL | Status: DC | PRN

## 2020-04-14 MED ORDER — CEFAZOLIN SODIUM-DEXTROSE 1-4 GM/50ML-% IV SOLN
INTRAVENOUS | Status: AC
  Filled 2020-04-14: qty 50

## 2020-04-14 MED ORDER — ROCURONIUM BROMIDE 10 MG/ML (PF) SYRINGE
PREFILLED_SYRINGE | INTRAVENOUS | Status: AC
  Filled 2020-04-14: qty 10

## 2020-04-14 MED ORDER — STERILE WATER FOR INJECTION IJ SOLN
INTRAMUSCULAR | Status: DC | PRN
  Administered 2020-04-14: 20 mL

## 2020-04-14 MED ORDER — BSS IO SOLN
INTRAOCULAR | Status: AC
  Filled 2020-04-14: qty 15

## 2020-04-14 MED ORDER — ACETAMINOPHEN 10 MG/ML IV SOLN
INTRAVENOUS | Status: AC
  Filled 2020-04-14: qty 100

## 2020-04-14 MED ORDER — DEXAMETHASONE SODIUM PHOSPHATE 10 MG/ML IJ SOLN
INTRAMUSCULAR | Status: AC
  Filled 2020-04-14: qty 1

## 2020-04-14 MED ORDER — POLYMYXIN B SULFATE 500000 UNITS IJ SOLR
INTRAMUSCULAR | Status: AC
  Filled 2020-04-14: qty 500000

## 2020-04-14 MED ORDER — FENTANYL CITRATE (PF) 100 MCG/2ML IJ SOLN
INTRAMUSCULAR | Status: DC | PRN
  Administered 2020-04-14: 25 ug via INTRAVENOUS
  Administered 2020-04-14 (×2): 50 ug via INTRAVENOUS
  Administered 2020-04-14 (×2): 25 ug via INTRAVENOUS

## 2020-04-14 MED ORDER — EPINEPHRINE PF 1 MG/ML IJ SOLN
INTRAOCULAR | Status: DC | PRN
  Administered 2020-04-14: 500 mL

## 2020-04-14 MED ORDER — CYCLOPENTOLATE HCL 1 % OP SOLN
1.0000 [drp] | OPHTHALMIC | Status: AC | PRN
  Administered 2020-04-14 (×3): 1 [drp] via OPHTHALMIC
  Filled 2020-04-14: qty 2

## 2020-04-14 MED ORDER — ONDANSETRON HCL 4 MG/2ML IJ SOLN: 4.0000 mg | Freq: Once | INTRAMUSCULAR | Status: DC | PRN

## 2020-04-14 MED ORDER — PROPOFOL 10 MG/ML IV BOLUS
INTRAVENOUS | Status: AC
  Filled 2020-04-14: qty 20

## 2020-04-14 MED ORDER — ONDANSETRON HCL 4 MG/2ML IJ SOLN
INTRAMUSCULAR | Status: DC | PRN
  Administered 2020-04-14: 4 mg via INTRAVENOUS

## 2020-04-14 MED ORDER — OXYCODONE HCL 5 MG/5ML PO SOLN: 5.0000 mg | Freq: Once | ORAL | Status: DC | PRN

## 2020-04-14 MED ORDER — DEXAMETHASONE SODIUM PHOSPHATE 10 MG/ML IJ SOLN
INTRAMUSCULAR | Status: DC | PRN
  Administered 2020-04-14: 5 mg via INTRAVENOUS

## 2020-04-14 MED ORDER — ATROPINE SULFATE 1 % OP SOLN
OPHTHALMIC | Status: AC
  Filled 2020-04-14: qty 5

## 2020-04-14 MED ORDER — LIDOCAINE HCL 2 % IJ SOLN
INTRAMUSCULAR | Status: DC | PRN
  Administered 2020-04-14: 5 mL via RETROBULBAR

## 2020-04-14 MED ORDER — LIDOCAINE 2% (20 MG/ML) 5 ML SYRINGE
INTRAMUSCULAR | Status: AC
  Filled 2020-04-14: qty 5

## 2020-04-14 MED ORDER — DEXAMETHASONE SODIUM PHOSPHATE 10 MG/ML IJ SOLN
INTRAMUSCULAR | Status: DC | PRN
  Administered 2020-04-14: 10 mg

## 2020-04-14 MED ORDER — SODIUM CHLORIDE (PF) 0.9 % IJ SOLN
INTRAMUSCULAR | Status: AC
  Filled 2020-04-14: qty 10

## 2020-04-14 MED ORDER — MIDAZOLAM HCL 2 MG/2ML IJ SOLN
INTRAMUSCULAR | Status: AC
  Filled 2020-04-14: qty 2

## 2020-04-14 MED ORDER — CEFAZOLIN SUBCONJUNCTIVAL INJECTION 100 MG/0.5 ML
INJECTION | SUBCONJUNCTIVAL | Status: DC | PRN
  Administered 2020-04-14: 100 mg via SUBCONJUNCTIVAL

## 2020-04-14 MED ORDER — HYPROMELLOSE (GONIOSCOPIC) 2.5 % OP SOLN
OPHTHALMIC | Status: AC
  Filled 2020-04-14: qty 15

## 2020-04-14 MED ORDER — HYALURONIDASE HUMAN 150 UNIT/ML IJ SOLN
INTRAMUSCULAR | Status: AC
  Filled 2020-04-14: qty 1

## 2020-04-14 MED ORDER — OFLOXACIN 0.3 % OP SOLN
1.0000 [drp] | OPHTHALMIC | Status: AC | PRN
  Administered 2020-04-14 (×3): 1 [drp] via OPHTHALMIC
  Filled 2020-04-14: qty 5

## 2020-04-14 MED ORDER — LACTATED RINGERS IV SOLN: INTRAVENOUS | Status: DC

## 2020-04-14 MED ORDER — ROCURONIUM BROMIDE 10 MG/ML (PF) SYRINGE
PREFILLED_SYRINGE | INTRAVENOUS | Status: DC | PRN
  Administered 2020-04-14 (×2): 10 mg via INTRAVENOUS
  Administered 2020-04-14: 20 mg via INTRAVENOUS
  Administered 2020-04-14: 50 mg via INTRAVENOUS

## 2020-04-14 MED ORDER — INDOCYANINE GREEN 25 MG IV SOLR
INTRAVENOUS | Status: AC
  Filled 2020-04-14: qty 10

## 2020-04-14 MED ORDER — SUGAMMADEX SODIUM 200 MG/2ML IV SOLN
INTRAVENOUS | Status: DC | PRN
  Administered 2020-04-14: 200 mg via INTRAVENOUS

## 2020-04-14 MED ORDER — BUPIVACAINE HCL (PF) 0.75 % IJ SOLN
INTRAMUSCULAR | Status: AC
  Filled 2020-04-14: qty 10

## 2020-04-14 MED ORDER — FENTANYL CITRATE (PF) 250 MCG/5ML IJ SOLN
INTRAMUSCULAR | Status: AC
  Filled 2020-04-14: qty 5

## 2020-04-14 MED ORDER — TOBRAMYCIN-DEXAMETHASONE 0.3-0.1 % OP OINT
TOPICAL_OINTMENT | OPHTHALMIC | Status: AC
  Filled 2020-04-14: qty 3.5

## 2020-04-14 MED ORDER — EPINEPHRINE PF 1 MG/ML IJ SOLN
INTRAMUSCULAR | Status: AC
  Filled 2020-04-14: qty 1

## 2020-04-14 MED ORDER — STERILE WATER FOR INJECTION IJ SOLN
INTRAMUSCULAR | Status: AC
  Filled 2020-04-14: qty 20

## 2020-04-14 MED ORDER — TOBRAMYCIN-DEXAMETHASONE 0.3-0.1 % OP OINT
TOPICAL_OINTMENT | OPHTHALMIC | Status: DC | PRN
  Administered 2020-04-14: 1 via OPHTHALMIC

## 2020-04-14 MED ORDER — HYPROMELLOSE (GONIOSCOPIC) 2.5 % OP SOLN
OPHTHALMIC | Status: DC | PRN
  Administered 2020-04-14: 1 [drp] via OPHTHALMIC

## 2020-04-14 MED ORDER — FENTANYL CITRATE (PF) 100 MCG/2ML IJ SOLN: 25.0000 ug | INTRAMUSCULAR | Status: DC | PRN

## 2020-04-14 MED ORDER — ORAL CARE MOUTH RINSE: 15.0000 mL | Freq: Once | OROMUCOSAL | Status: AC

## 2020-04-14 MED ORDER — MIDAZOLAM HCL 5 MG/5ML IJ SOLN
INTRAMUSCULAR | Status: DC | PRN
  Administered 2020-04-14: 2 mg via INTRAVENOUS

## 2020-04-14 MED ORDER — AMISULPRIDE (ANTIEMETIC) 5 MG/2ML IV SOLN: 10.0000 mg | Freq: Once | INTRAVENOUS | Status: DC | PRN

## 2020-04-14 MED ORDER — ONDANSETRON HCL 4 MG/2ML IJ SOLN
INTRAMUSCULAR | Status: AC
  Filled 2020-04-14: qty 2

## 2020-04-14 MED ORDER — LIDOCAINE 2% (20 MG/ML) 5 ML SYRINGE
INTRAMUSCULAR | Status: DC | PRN
  Administered 2020-04-14: 60 mg via INTRAVENOUS

## 2020-04-14 MED ORDER — CEFTAZIDIME 1 G IJ SOLR
INTRAMUSCULAR | Status: AC
  Filled 2020-04-14: qty 1

## 2020-04-14 MED ORDER — CEFAZOLIN SUBCONJUNCTIVAL INJECTION 100 MG/0.5 ML
100.0000 mg | INJECTION | SUBCONJUNCTIVAL | Status: DC
  Filled 2020-04-14: qty 5

## 2020-04-14 MED ORDER — CHLORHEXIDINE GLUCONATE 0.12 % MT SOLN
15.0000 mL | Freq: Once | OROMUCOSAL | Status: AC
  Administered 2020-04-14: 15 mL via OROMUCOSAL
  Filled 2020-04-14: qty 15

## 2020-04-14 SURGICAL SUPPLY — 57 items
APPLICATOR COTTON TIP 6 STRL (MISCELLANEOUS) ×2 IMPLANT
APPLICATOR COTTON TIP 6IN STRL (MISCELLANEOUS) ×4
APPLICATOR DR MATTHEWS STRL (MISCELLANEOUS) ×4 IMPLANT
BAND SCLERAL BUCKLING TYPE 41 (Ophthalmic Related) ×4 IMPLANT
BAND WRIST GAS GREEN (MISCELLANEOUS) IMPLANT
BLADE MINI 60D BLUE (BLADE) ×8 IMPLANT
BLADE MINI RND TIP GREEN BEAV (BLADE) IMPLANT
BNDG EYE OVAL (GAUZE/BANDAGES/DRESSINGS) ×4 IMPLANT
CANNULA ANT CHAM MAIN (OPHTHALMIC RELATED) IMPLANT
CANNULA DUALBORE 25G (CANNULA) ×4 IMPLANT
CANNULA VLV SOFT TIP 25GA (OPHTHALMIC) ×4 IMPLANT
CANNULA VLV SOFT TIP 27GA (OPHTHALMIC) IMPLANT
CAUTERY EYE LOW TEMP 1300F FIN (OPHTHALMIC RELATED) ×4 IMPLANT
CORD BIPOLAR FORCEPS 12FT (ELECTRODE) ×4 IMPLANT
COVER SURGICAL LIGHT HANDLE (MISCELLANEOUS) ×4 IMPLANT
COVER WAND RF STERILE (DRAPES) ×4 IMPLANT
FILTER STRAW FLUID ASPIR (MISCELLANEOUS) IMPLANT
FORCEPS GRIESHABER ILM 25G A (INSTRUMENTS) ×4 IMPLANT
GAS OPHTHALMIC (MISCELLANEOUS) IMPLANT
GAS WRIST BAND GREEN (MISCELLANEOUS)
GLOVE SURG SYN 7.5  E (GLOVE) ×2
GLOVE SURG SYN 7.5 E (GLOVE) ×2 IMPLANT
GOWN STRL REUS W/ TWL LRG LVL3 (GOWN DISPOSABLE) ×4 IMPLANT
GOWN STRL REUS W/TWL LRG LVL3 (GOWN DISPOSABLE) ×4
KIT BASIN OR (CUSTOM PROCEDURE TRAY) ×4 IMPLANT
KIT PERFLUORON PROCEDURE 5ML (MISCELLANEOUS) IMPLANT
KIT TURNOVER KIT B (KITS) ×4 IMPLANT
KNIFE CRESCENT 2.5 55 ANG (BLADE) ×4 IMPLANT
LENS BIOM SUPER VIEW SET DISP (MISCELLANEOUS) ×4 IMPLANT
NEEDLE 18GX1X1/2 (RX/OR ONLY) (NEEDLE) ×8 IMPLANT
NEEDLE HYPO 25GX1X1/2 BEV (NEEDLE) ×4 IMPLANT
NEEDLE HYPO 30X.5 LL (NEEDLE) ×8 IMPLANT
NS IRRIG 1000ML POUR BTL (IV SOLUTION) ×4 IMPLANT
OIL SILICONE OPHTHALMIC 1000 (Ophthalmic Related) ×4 IMPLANT
PACK VITRECTOMY CUSTOM (CUSTOM PROCEDURE TRAY) ×4 IMPLANT
PAD ARMBOARD 7.5X6 YLW CONV (MISCELLANEOUS) ×4 IMPLANT
PAK PIK VITRECTOMY CVS 25GA (OPHTHALMIC) ×4 IMPLANT
PROBE ENDO DIATHERMY 25G (MISCELLANEOUS) ×4 IMPLANT
PROBE LASER ILLUM FLEX CVD 25G (OPHTHALMIC) ×4 IMPLANT
RETRACTOR IRIS FLEX 25G GRIESH (INSTRUMENTS) IMPLANT
ROLLS DENTAL (MISCELLANEOUS) IMPLANT
SET INJECTOR OIL FLUID CONSTEL (OPHTHALMIC) ×4 IMPLANT
SHEET MEDIUM DRAPE 40X70 STRL (DRAPES) ×4 IMPLANT
SHIELD EYE LENSE ONLY DISP (GAUZE/BANDAGES/DRESSINGS) ×4 IMPLANT
SOL ANTI FOG 6CC (MISCELLANEOUS) ×2 IMPLANT
SOLUTION ANTI FOG 6CC (MISCELLANEOUS) ×2
SPONGE GROOVED SILICONE 4X12X8 (Ophthalmic Related) ×4 IMPLANT
STOCKINETTE IMPERVIOUS 9X36 MD (GAUZE/BANDAGES/DRESSINGS) ×8 IMPLANT
STOPCOCK 4 WAY LG BORE MALE ST (IV SETS) IMPLANT
SUT ETHILON 5.0 S-24 (SUTURE) IMPLANT
SUT SILK 2 0 (SUTURE) ×2
SUT SILK 2-0 18XBRD TIE 12 (SUTURE) ×2 IMPLANT
SUT VICRYL 8 0 TG140 8 (SUTURE) ×4 IMPLANT
SYR 20ML LL LF (SYRINGE) IMPLANT
TOWEL GREEN STERILE FF (TOWEL DISPOSABLE) ×8 IMPLANT
WATER STERILE IRR 1000ML POUR (IV SOLUTION) ×4 IMPLANT
WIPE INSTRUMENT VISIWIPE 73X73 (MISCELLANEOUS) IMPLANT

## 2020-04-14 NOTE — Anesthesia Procedure Notes (Signed)
Procedure Name: Intubation Date/Time: 04/14/2020 5:11 PM Performed by: Trinna Post., CRNA Pre-anesthesia Checklist: Patient identified, Emergency Drugs available, Suction available, Patient being monitored and Timeout performed Patient Re-evaluated:Patient Re-evaluated prior to induction Oxygen Delivery Method: Circle system utilized Preoxygenation: Pre-oxygenation with 100% oxygen Induction Type: IV induction Ventilation: Mask ventilation without difficulty Laryngoscope Size: Mac and 3 Grade View: Grade I Tube type: Oral Tube size: 7.0 mm Number of attempts: 1 Airway Equipment and Method: Stylet Placement Confirmation: ETT inserted through vocal cords under direct vision,  positive ETCO2 and breath sounds checked- equal and bilateral Secured at: 22 cm Tube secured with: Tape Dental Injury: Teeth and Oropharynx as per pre-operative assessment

## 2020-04-14 NOTE — Brief Op Note (Signed)
04/14/2020  8:50 PM  PATIENT:  Shelly Hill  29 y.o. 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  PROCEDURE:  Procedure(s): SCLERAL BUCKLE (Right) INJECTION OF SILICONE OIL (Right) PARS PLANA VITRECTOMY WITH 25 GAUGE (Right) PHOTOCOAGULATION WITH LASER (Right) MEMBRANECTOMY (Right) DRAINAGE OF SUBRETINAL FLUID (Right) RETINECTOMY (Right)  SURGEON:  Surgeon(s) and Role:    * Carmela Rima, MD - Primary  PHYSICIAN ASSISTANT:   ASSISTANTS: none   ANESTHESIA:   local and general  EBL:  minimal  BLOOD ADMINISTERED:none  DRAINS: none   LOCAL MEDICATIONS USED:  MARCAINE    and LIDOCAINE   SPECIMEN:  No Specimen  DISPOSITION OF SPECIMEN:  N/A  COUNTS:  YES  TOURNIQUET:  * No tourniquets in log *  DICTATION: .Note written in EPIC  PLAN OF CARE: Discharge to home after PACU  PATIENT DISPOSITION:  PACU - hemodynamically stable.   Delay start of Pharmacological VTE agent (>24hrs) due to surgical blood loss or risk of bleeding: not applicable

## 2020-04-14 NOTE — H&P (Signed)
  Date of examination:  04/14/20  Indication for surgery: recurrent retinal detachment right eye  Pertinent past medical history:  Past Medical History:  Diagnosis Date  . ADHD   . Anemia   . Anxiety   . Depression   . 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 Depression Mother   . GER disease Mother   . Depression Father   . Insomnia Father   . Heart disease Maternal Grandmother   . Heart disease Maternal Grandfather   . Cancer Maternal Grandfather        lung cancer  . Diabetes Paternal Grandmother     General:  Healthy appearing patient in no distress.    Eyes:    Acuity OD 20/400  External: Within normal limits  Anterior segment: Within normal limits       Fundus:macula off retinal detachment right eye   Impression: Recurrent retinal detachment right eye  Plan:  Retinal detachment repair right eye  Carmela Rima, MD

## 2020-04-14 NOTE — Discharge Instructions (Signed)
DO NOT SLEEP ON BACK, THE EYE PRESSURE CAN GO UP AND CAUSE VISION LOSS   SLEEP ON SIDE WITH NOSE TO PILLOW  DURING DAY KEEP UPRIGHT 

## 2020-04-14 NOTE — Transfer of Care (Signed)
Immediate Anesthesia Transfer of Care Note  Patient: Shelly Hill  Procedure(s) Performed: SCLERAL BUCKLE (Right Eye) INJECTION OF SILICONE OIL (Right Eye) PARS PLANA VITRECTOMY WITH 25 GAUGE (Right Eye) PHOTOCOAGULATION WITH LASER (Right Eye)  Patient Location: PACU  Anesthesia Type:General  Level of Consciousness: awake  Airway & Oxygen Therapy: Patient Spontanous Breathing  Post-op Assessment: Report given to RN and Post -op Vital signs reviewed and stable  Post vital signs: Reviewed and stable  Last Vitals:  Vitals Value Taken Time  BP 119/77 04/14/20 2107  Temp    Pulse 100 04/14/20 2109  Resp 15 04/14/20 2109  SpO2 99 % 04/14/20 2109  Vitals shown include unvalidated device data.  Last Pain:  Vitals:   04/14/20 1420  TempSrc:   PainSc: 0-No pain      Patients Stated Pain Goal: 2 (04/14/20 1420)  Complications: No complications documented.

## 2020-04-14 NOTE — Anesthesia Preprocedure Evaluation (Signed)
Anesthesia Evaluation  Patient identified by MRN, date of birth, ID band Patient awake    Reviewed: Allergy & Precautions, NPO status , Patient's Chart, lab work & pertinent test results  History of Anesthesia Complications Negative for: history of anesthetic complications  Airway Mallampati: II  TM Distance: >3 FB Neck ROM: Full    Dental  (+) Teeth Intact   Pulmonary neg pulmonary ROS, former smoker,    Pulmonary exam normal        Cardiovascular negative cardio ROS Normal cardiovascular exam     Neuro/Psych Anxiety Depression negative neurological ROS     GI/Hepatic negative GI ROS, Neg liver ROS,   Endo/Other  negative endocrine ROS  Renal/GU negative Renal ROS  negative genitourinary   Musculoskeletal negative musculoskeletal ROS (+)   Abdominal   Peds  Hematology negative hematology ROS (+)   Anesthesia Other Findings   Reproductive/Obstetrics                             Anesthesia Physical Anesthesia Plan  ASA: II  Anesthesia Plan: General   Post-op Pain Management:    Induction: Intravenous  PONV Risk Score and Plan: 3 and Ondansetron, Dexamethasone, Treatment may vary due to age or medical condition and Midazolam  Airway Management Planned: Oral ETT  Additional Equipment: None  Intra-op Plan:   Post-operative Plan: Extubation in OR  Informed Consent: I have reviewed the patients History and Physical, chart, labs and discussed the procedure including the risks, benefits and alternatives for the proposed anesthesia with the patient or authorized representative who has indicated his/her understanding and acceptance.     Dental advisory given  Plan Discussed with:   Anesthesia Plan Comments:         Anesthesia Quick Evaluation

## 2020-04-15 ENCOUNTER — Encounter (HOSPITAL_COMMUNITY): Payer: Self-pay | Admitting: Ophthalmology

## 2020-04-15 NOTE — Anesthesia Postprocedure Evaluation (Signed)
Anesthesia Post Note  Patient: Shelly Hill  Procedure(s) Performed: SCLERAL BUCKLE (Right Eye) INJECTION OF SILICONE OIL (Right Eye) PARS PLANA VITRECTOMY WITH 25 GAUGE (Right Eye) PHOTOCOAGULATION WITH LASER (Right Eye)     Patient location during evaluation: PACU Anesthesia Type: General Level of consciousness: awake and alert Pain management: pain level controlled Vital Signs Assessment: post-procedure vital signs reviewed and stable Respiratory status: spontaneous breathing, nonlabored ventilation, respiratory function stable and patient connected to nasal cannula oxygen Cardiovascular status: blood pressure returned to baseline and stable Postop Assessment: no apparent nausea or vomiting Anesthetic complications: no   No complications documented.  Last Vitals:  Vitals:   04/14/20 2107 04/14/20 2121  BP: 119/77 116/77  Pulse: (!) 105 89  Resp: 17 15  Temp: (!) 36.2 C   SpO2: 100% 97%    Last Pain:  Vitals:   04/14/20 2121  TempSrc:   PainSc: 2                  Ayce Pietrzyk

## 2020-04-23 NOTE — Op Note (Signed)
Jowana Thumma 03/28/2020 Diagnosis: retinal detachment right eye  Procedure: Pars Plana Vitrectomy, Endolaser, Air/Fluid Exchange, perflouron, endocautery, drainage of subretinal fluid, and C3F8 gas tamponade Operative Eye:  right eye  Surgeon: Harrold Donath Estimated Blood Loss: minimal Specimens for Pathology:  None Complications: none   The  patient was prepped and draped in the usual fashion for ocular surgery on the  right eye .  A lid speculum was placed.  Infusion line and trocar was placed at the 8 o'clock position approximately 3.5 mm from the surgical limbus.   The infusion line was allowed to run and then clamped when placed at the cannula opening. The line was inserted and secured to the drape with an adhesive strip.   Active trocars/cannula were placed at the 10 and 2 o'clock positions approximately 3.5 mm from the surgical limbus. The cannula was visualized in the vitreous cavity.  The light pipe and vitreous cutter were inserted into the vitreous cavity and a core vitrectomy was performed.  The posterior hyaloid was elevated at the disc and then vitrectomized.  Care taken to remove the vitreous up to the vitreous base for 360 degrees ensuring minimal mobilization of the retina.   The inferior retina was very atrophic with marked thinning.  Endocautery was used to make a posterior retinotomy and thick subretinal fluid was drained to flatten the retina.  Perflouron was used to flatten the macula as peripheral subretinal fluid was drained.  A complete air/fluid exchange was performed and any remaining subretinal fluid was drained.  After the retina was flattened, perflourong was removed from the eye.    3 rows of endolaser were applied 360 degrees to the periphery and surrounding the superotemporal breaks.  There was limitied laser uptake in the atrophic inferior retina.  Laser was applied posterior to the atrophic retina.  14% C3F8 gas was placed in the eye.    The superior  cannulas were sequentially removed with concommitant tamponade using a cotton tipped applicator and noted to be air tight.  The infusion line and trocar were removed and the sclerotomy was noted to be air tight with normal intraocular pressure by digital palpapation.  Subconjunctival injections of  Dexamethasone 4mg /39ml was placed in the infero-medial quadrant.   The speculum and drapes were removed and the eye was patched with Polymixin/Bacitracin ophthalmic ointment. An eye shield was placed and the patient was transferred alert and conversant with stable vital signs to the post operative recovery area.  The patient tolerated the procedure well and no complications were noted.  0m MD

## 2020-05-14 NOTE — Op Note (Signed)
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  A lid speculum was placed.  A 360 degree peritomy was made and tenons dissected back in each quadrant.  The quadrants were inspected to be free of masses, ectasia, or prominent vessels.  3-0 silk was used to isolate the rectus muscles.  Tenons was dissected back to allow placement of the buckle element.  Attention was then directed to the vitrectomy portion of the procedure.   Infusion line and trocar was placed at the 8 o'clock position approximately 3.5 mm from the surgical limbus.   The infusion line was allowed to run and then clamped when placed at the cannula opening. The line was inserted and secured to the drape with an adhesive strip.   Active trocars/cannula were placed at the 10 and 2 o'clock positions approximately 3.5 mm from the surgical limbus. The cannula was visualized in the vitreous cavity.  The light pipe and vitreous cutter were inserted into the vitreous cavity and a careful peripheral vitrectomy was performed to shave the  vitreous base.  Proliferative vitreoretinopathy was noted in the inferonasal quadrant with associated star folds.  The tano scratcher was used to elevate the proliferative scar tissue and then removed with ILM forceps.   After membranectomy, attention was directed towards placement of the scleral buckle.  5-0 Merselene mattress sutures were placed in each quadrant.   A 41 band was placed under  the rectus muscles.and preplaced mattress sutures and joined with a Watzke sleeve in the inferotemporal quadrant.  The mattress sutures were tied in place and the buckle secured to attain adequate imbrication.  To better support the retinal hole and atrophic retina at 3 o'clock position, a segmental silicone scleral sponge was placed under the 41 band and secured with 5-0 merselene to support the peripheral retina.  Attention was redirected to the vitrectomy portion of the procedure.    A complete air/fluid exchange was performed and subretinal fluid drained from the posterior retinotomy.  Endolaser was applied 360 degrees to the periphery and surrounding the nasal break.  1000 centistoke silicone oil was placed in the eye to achieve a near complete oil fill.  The trocars were sequentially removed and each sclerotomy closed with 8-0 Vicryl suture.  The overlying conjunctiva was closed after removing the silk sutures.  Normal intraocular pressure was noted by digital palpapation.  Subconjunctival injection of  Dexamethasone 4mg /72ml was placed.   The speculum and drapes were removed and the eye was patched with Polymixin/Bacitracin ophthalmic ointment. An eye shield was placed and the patient was transferred alert and conversant with stable vital signs to the post operative recovery area.  The patient tolerated the procedure well and no complications were noted.  0m MD

## 2020-08-18 ENCOUNTER — Other Ambulatory Visit (HOSPITAL_COMMUNITY)
Admission: RE | Admit: 2020-08-18 | Discharge: 2020-08-18 | Disposition: A | Discharge status: Home / Self Care | Payer: 59 | Source: Ambulatory Visit | Attending: Ophthalmology | Admitting: Ophthalmology

## 2020-08-18 ENCOUNTER — Other Ambulatory Visit: Payer: Self-pay

## 2020-08-18 DIAGNOSIS — Z01812 Encounter for preprocedural laboratory examination: Secondary | ICD-10-CM | POA: Insufficient documentation

## 2020-08-18 DIAGNOSIS — Z20822 Contact with and (suspected) exposure to covid-19: Secondary | ICD-10-CM | POA: Diagnosis not present

## 2020-08-18 LAB — SARS CORONAVIRUS 2 (TAT 6-24 HRS): SARS Coronavirus 2: NEGATIVE

## 2020-08-18 NOTE — Progress Notes (Signed)
Shelly Hill denies chest pain or shortness of breath. Patient was tested for Covid today and has been in quarantine since that time.

## 2020-08-19 ENCOUNTER — Encounter (HOSPITAL_COMMUNITY): Payer: Self-pay | Admitting: Ophthalmology

## 2020-08-19 ENCOUNTER — Inpatient Hospital Stay (HOSPITAL_COMMUNITY): Payer: 59 | Admitting: Certified Registered"

## 2020-08-19 ENCOUNTER — Encounter (HOSPITAL_COMMUNITY)
Admission: RE | Disposition: A | Discharge status: Home / Self Care | Payer: Self-pay | Source: Home / Self Care | Attending: Ophthalmology

## 2020-08-19 ENCOUNTER — Inpatient Hospital Stay (HOSPITAL_COMMUNITY): Payer: 59

## 2020-08-19 ENCOUNTER — Ambulatory Visit (HOSPITAL_COMMUNITY)
Admission: RE | Admit: 2020-08-19 | Discharge: 2020-08-19 | Disposition: A | Discharge status: Home / Self Care | Payer: 59 | Attending: Ophthalmology | Admitting: Ophthalmology

## 2020-08-19 DIAGNOSIS — Z833 Family history of diabetes mellitus: Secondary | ICD-10-CM | POA: Diagnosis not present

## 2020-08-19 DIAGNOSIS — Z801 Family history of malignant neoplasm of trachea, bronchus and lung: Secondary | ICD-10-CM | POA: Insufficient documentation

## 2020-08-19 DIAGNOSIS — Z8249 Family history of ischemic heart disease and other diseases of the circulatory system: Secondary | ICD-10-CM | POA: Diagnosis not present

## 2020-08-19 DIAGNOSIS — Z48810 Encounter for surgical aftercare following surgery on the sense organs: Secondary | ICD-10-CM | POA: Diagnosis not present

## 2020-08-19 DIAGNOSIS — Z419 Encounter for procedure for purposes other than remedying health state, unspecified: Secondary | ICD-10-CM

## 2020-08-19 HISTORY — PX: PARS PLANA VITRECTOMY: SHX2166

## 2020-08-19 LAB — CBC
HCT: 39 % (ref 36.0–46.0)
Hemoglobin: 14 g/dL (ref 12.0–15.0)
MCH: 32 pg (ref 26.0–34.0)
MCHC: 35.9 g/dL (ref 30.0–36.0)
MCV: 89 fL (ref 80.0–100.0)
Platelets: 197 10*3/uL (ref 150–400)
RBC: 4.38 MIL/uL (ref 3.87–5.11)
RDW: 11.6 % (ref 11.5–15.5)
WBC: 4.7 10*3/uL (ref 4.0–10.5)
nRBC: 0 % (ref 0.0–0.2)

## 2020-08-19 LAB — POCT PREGNANCY, URINE: Preg Test, Ur: NEGATIVE

## 2020-08-19 SURGERY — PARS PLANA VITRECTOMY WITH 25 GAUGE
Anesthesia: Monitor Anesthesia Care | Site: Eye | Laterality: Right

## 2020-08-19 MED ORDER — HYDROMORPHONE HCL 1 MG/ML IJ SOLN: 0.2500 mg | INTRAMUSCULAR | Status: DC | PRN

## 2020-08-19 MED ORDER — HYPROMELLOSE (GONIOSCOPIC) 2.5 % OP SOLN
OPHTHALMIC | Status: DC | PRN
  Administered 2020-08-19: 2 [drp] via OPHTHALMIC

## 2020-08-19 MED ORDER — CHLORHEXIDINE GLUCONATE 0.12 % MT SOLN
15.0000 mL | Freq: Once | OROMUCOSAL | Status: AC
  Administered 2020-08-19: 15 mL via OROMUCOSAL
  Filled 2020-08-19: qty 15

## 2020-08-19 MED ORDER — HYALURONIDASE HUMAN NICU 150 UNIT/ML INJECTION
INTRAMUSCULAR | Status: DC | PRN
  Administered 2020-08-19: 150 [IU]

## 2020-08-19 MED ORDER — ORAL CARE MOUTH RINSE: 15.0000 mL | Freq: Once | OROMUCOSAL | Status: AC

## 2020-08-19 MED ORDER — LIDOCAINE HCL 2 % IJ SOLN
INTRAMUSCULAR | Status: AC
  Filled 2020-08-19: qty 20

## 2020-08-19 MED ORDER — ACETAMINOPHEN 500 MG PO TABS: 1000.0000 mg | ORAL_TABLET | Freq: Once | ORAL | Status: AC

## 2020-08-19 MED ORDER — HYALURONIDASE HUMAN 150 UNIT/ML IJ SOLN
INTRAMUSCULAR | Status: AC
  Filled 2020-08-19: qty 1

## 2020-08-19 MED ORDER — TOBRAMYCIN-DEXAMETHASONE 0.3-0.1 % OP OINT
TOPICAL_OINTMENT | OPHTHALMIC | Status: AC
  Filled 2020-08-19: qty 3.5

## 2020-08-19 MED ORDER — MIDAZOLAM HCL 2 MG/2ML IJ SOLN
INTRAMUSCULAR | Status: AC
  Filled 2020-08-19: qty 2

## 2020-08-19 MED ORDER — EPINEPHRINE PF 1 MG/ML IJ SOLN
INTRAMUSCULAR | Status: AC
  Filled 2020-08-19: qty 1

## 2020-08-19 MED ORDER — FENTANYL CITRATE (PF) 100 MCG/2ML IJ SOLN
INTRAMUSCULAR | Status: AC
  Filled 2020-08-19: qty 2

## 2020-08-19 MED ORDER — FENTANYL CITRATE (PF) 100 MCG/2ML IJ SOLN
INTRAMUSCULAR | Status: DC | PRN
  Administered 2020-08-19 (×4): 25 ug via INTRAVENOUS

## 2020-08-19 MED ORDER — TOBRAMYCIN-DEXAMETHASONE 0.3-0.1 % OP SUSP
OPHTHALMIC | Status: AC
  Filled 2020-08-19: qty 2.5

## 2020-08-19 MED ORDER — OFLOXACIN 0.3 % OP SOLN
1.0000 [drp] | OPHTHALMIC | Status: AC | PRN
  Administered 2020-08-19 (×2): 1 [drp] via OPHTHALMIC
  Filled 2020-08-19: qty 5

## 2020-08-19 MED ORDER — ATROPINE SULFATE 1 % OP SOLN
OPHTHALMIC | Status: AC
  Filled 2020-08-19: qty 5

## 2020-08-19 MED ORDER — BSS IO SOLN
INTRAOCULAR | Status: AC
  Filled 2020-08-19: qty 15

## 2020-08-19 MED ORDER — SODIUM CHLORIDE 0.9 % IV SOLN: INTRAVENOUS | Status: DC | PRN

## 2020-08-19 MED ORDER — MIDAZOLAM HCL 2 MG/2ML IJ SOLN
INTRAMUSCULAR | Status: DC | PRN
  Administered 2020-08-19 (×2): 2 mg via INTRAVENOUS

## 2020-08-19 MED ORDER — CEFAZOLIN SUBCONJUNCTIVAL INJECTION 100 MG/0.5 ML
100.0000 mg | INJECTION | SUBCONJUNCTIVAL | Status: AC
  Administered 2020-08-19: 100 mg via SUBCONJUNCTIVAL
  Filled 2020-08-19: qty 1

## 2020-08-19 MED ORDER — HYPROMELLOSE (GONIOSCOPIC) 2.5 % OP SOLN
OPHTHALMIC | Status: AC
  Filled 2020-08-19: qty 15

## 2020-08-19 MED ORDER — BSS PLUS IO SOLN
INTRAOCULAR | Status: DC | PRN
  Administered 2020-08-19: 1 via INTRAOCULAR

## 2020-08-19 MED ORDER — DEXAMETHASONE SODIUM PHOSPHATE 10 MG/ML IJ SOLN
INTRAMUSCULAR | Status: DC | PRN
  Administered 2020-08-19: 10 mg

## 2020-08-19 MED ORDER — PROPOFOL 10 MG/ML IV BOLUS
INTRAVENOUS | Status: DC | PRN
  Administered 2020-08-19: 20 mg via INTRAVENOUS
  Administered 2020-08-19: 30 mg via INTRAVENOUS

## 2020-08-19 MED ORDER — BUPIVACAINE HCL (PF) 0.75 % IJ SOLN
INTRAMUSCULAR | Status: AC
  Filled 2020-08-19: qty 10

## 2020-08-19 MED ORDER — BSS IO SOLN
INTRAOCULAR | Status: DC | PRN
  Administered 2020-08-19: 15 mL via INTRAOCULAR

## 2020-08-19 MED ORDER — CYCLOPENTOLATE HCL 1 % OP SOLN
1.0000 [drp] | OPHTHALMIC | Status: AC | PRN
  Administered 2020-08-19 (×3): 1 [drp] via OPHTHALMIC
  Filled 2020-08-19: qty 2

## 2020-08-19 MED ORDER — LIDOCAINE HCL 2 % IJ SOLN
INTRAMUSCULAR | Status: DC | PRN
  Administered 2020-08-19: 5 mL via RETROBULBAR

## 2020-08-19 MED ORDER — BSS PLUS IO SOLN
INTRAOCULAR | Status: AC
  Filled 2020-08-19: qty 500

## 2020-08-19 MED ORDER — PROPARACAINE HCL 0.5 % OP SOLN
1.0000 [drp] | OPHTHALMIC | Status: AC | PRN
  Administered 2020-08-19 (×2): 1 [drp] via OPHTHALMIC
  Filled 2020-08-19: qty 15

## 2020-08-19 MED ORDER — TOBRAMYCIN-DEXAMETHASONE 0.3-0.1 % OP OINT
TOPICAL_OINTMENT | OPHTHALMIC | Status: DC | PRN
  Administered 2020-08-19: 1 via OPHTHALMIC

## 2020-08-19 MED ORDER — ACETAMINOPHEN 500 MG PO TABS
ORAL_TABLET | ORAL | Status: AC
  Administered 2020-08-19: 1000 mg via ORAL
  Filled 2020-08-19: qty 2

## 2020-08-19 MED ORDER — SODIUM CHLORIDE 0.9 % IV SOLN: INTRAVENOUS | Status: DC

## 2020-08-19 MED ORDER — EPINEPHRINE PF 1 MG/ML IJ SOLN
INTRAOCULAR | Status: DC | PRN
  Administered 2020-08-19: .3 mL

## 2020-08-19 MED ORDER — DEXAMETHASONE SODIUM PHOSPHATE 10 MG/ML IJ SOLN
INTRAMUSCULAR | Status: AC
  Filled 2020-08-19: qty 1

## 2020-08-19 MED ORDER — PHENYLEPHRINE HCL 2.5 % OP SOLN
1.0000 [drp] | OPHTHALMIC | Status: AC | PRN
  Administered 2020-08-19 (×3): 1 [drp] via OPHTHALMIC
  Filled 2020-08-19: qty 2

## 2020-08-19 MED ORDER — STERILE WATER FOR IRRIGATION IR SOLN
Status: DC | PRN
  Administered 2020-08-19: 500 mL

## 2020-08-19 SURGICAL SUPPLY — 55 items
APPLICATOR COTTON TIP 6 STRL (MISCELLANEOUS) ×1 IMPLANT
APPLICATOR COTTON TIP 6IN STRL (MISCELLANEOUS) ×2
BAND WRIST GAS GREEN (MISCELLANEOUS) IMPLANT
BLADE MVR KNIFE 20G (BLADE) IMPLANT
BLADE STAB KNIFE 15DEG (BLADE) IMPLANT
CANNULA ANT CHAM MAIN (OPHTHALMIC RELATED) IMPLANT
CANNULA DUAL BORE 23G (CANNULA) IMPLANT
CANNULA DUALBORE 25G (CANNULA) IMPLANT
CANNULA VLV SOFT TIP 25GA (OPHTHALMIC) ×2 IMPLANT
CAUTERY EYE LOW TEMP 1300F FIN (OPHTHALMIC RELATED) IMPLANT
CLSR STERI-STRIP ANTIMIC 1/2X4 (GAUZE/BANDAGES/DRESSINGS) ×2 IMPLANT
COVER MAYO STAND STRL (DRAPES) ×2 IMPLANT
DRAPE HALF SHEET 40X57 (DRAPES) ×2 IMPLANT
DRAPE INCISE 51X51 W/FILM STRL (DRAPES) IMPLANT
DRAPE RETRACTOR (MISCELLANEOUS) ×2 IMPLANT
FORCEPS ECKARDT ILM 25G SERR (OPHTHALMIC RELATED) IMPLANT
FORCEPS GRIESHABER ILM 25G A (INSTRUMENTS) IMPLANT
GAS AUTO FILL CONSTEL (OPHTHALMIC)
GAS AUTO FILL CONSTELLATION (OPHTHALMIC) IMPLANT
GAS WRIST BAND GREEN (MISCELLANEOUS)
GLOVE SURG SYN 7.5  E (GLOVE) ×1
GLOVE SURG SYN 7.5 E (GLOVE) ×1 IMPLANT
GOWN STRL REUS W/ TWL LRG LVL3 (GOWN DISPOSABLE) ×1 IMPLANT
GOWN STRL REUS W/TWL LRG LVL3 (GOWN DISPOSABLE) ×1
KIT BASIN OR (CUSTOM PROCEDURE TRAY) ×2 IMPLANT
KIT TURNOVER KIT B (KITS) ×2 IMPLANT
LENS BIOM SUPER VIEW SET DISP (MISCELLANEOUS) ×2 IMPLANT
MICROPICK 25G (MISCELLANEOUS)
NEEDLE 18GX1X1/2 (RX/OR ONLY) (NEEDLE) ×4 IMPLANT
NEEDLE 25GX 5/8IN NON SAFETY (NEEDLE) ×2 IMPLANT
NEEDLE FILTER BLUNT 18X 1/2SAF (NEEDLE) ×2
NEEDLE FILTER BLUNT 18X1 1/2 (NEEDLE) ×2 IMPLANT
NEEDLE HYPO 25GX1X1/2 BEV (NEEDLE) IMPLANT
NEEDLE HYPO 30X.5 LL (NEEDLE) IMPLANT
NEEDLE RETROBULBAR 25GX1.5 (NEEDLE) ×2 IMPLANT
NS IRRIG 1000ML POUR BTL (IV SOLUTION) ×2 IMPLANT
PACK FRAGMATOME (OPHTHALMIC) IMPLANT
PACK VITRECTOMY CUSTOM (CUSTOM PROCEDURE TRAY) ×2 IMPLANT
PAD ARMBOARD 7.5X6 YLW CONV (MISCELLANEOUS) ×4 IMPLANT
PAK PIK VITRECTOMY CVS 25GA (OPHTHALMIC) ×2 IMPLANT
PICK MICROPICK 25G (MISCELLANEOUS) IMPLANT
PROBE ENDO DIATHERMY 25G (MISCELLANEOUS) ×2 IMPLANT
PROBE LASER ILLUM FLEX CVD 25G (OPHTHALMIC) ×2 IMPLANT
ROLLS DENTAL (MISCELLANEOUS) IMPLANT
SCRAPER DIAMOND 25GA (OPHTHALMIC RELATED) IMPLANT
SOL ANTI FOG 6CC (MISCELLANEOUS) ×1 IMPLANT
SOLUTION ANTI FOG 6CC (MISCELLANEOUS) ×1
STOPCOCK 4 WAY LG BORE MALE ST (IV SETS) ×2 IMPLANT
SUT VICRYL 7 0 TG140 8 (SUTURE) IMPLANT
SUT VICRYL 8 0 TG140 8 (SUTURE) ×2 IMPLANT
SYR 10ML LL (SYRINGE) ×2 IMPLANT
SYR 20ML LL LF (SYRINGE) ×2 IMPLANT
SYR 5ML LL (SYRINGE) ×2 IMPLANT
SYR TB 1ML LUER SLIP (SYRINGE) ×2 IMPLANT
WATER STERILE IRR 1000ML POUR (IV SOLUTION) ×2 IMPLANT

## 2020-08-19 NOTE — Discharge Instructions (Signed)
DO NOT SLEEP ON BACK, THE EYE PRESSURE CAN GO UP AND CAUSE VISION LOSS   SLEEP ON SIDE WITH NOSE TO PILLOW  DURING DAY KEEP UPRIGHT 

## 2020-08-19 NOTE — Op Note (Signed)
Shelly Hill 08/19/2020 Diagnosis: Retained silicone oil right eye and history of retinal detachment right eye  Procedure: Pars Plana Vitrectomy, Endolaser and air/fluid exchange right eye Operative Eye:  right eye  Surgeon: Harrold Donath Estimated Blood Loss: minimal Specimens for Pathology:  None Complications: none   The  patient was prepped and draped in the usual fashion for ocular surgery on the  right eye .  A lid speculum was placed.  A superior peritomy was made.   Infusion line and trocar was placed at the 9 o'clock position approximately 3.5 mm from the surgical limbus.     Active trocars/cannula were placed at the 10 and 2 o'clock positions approximately 3.5 mm from the surgical limbus. The cannula was visualized in the vitreous cavity.  The viscous fluid extraction tip was inserted into the superotemporal trocar and silicone oil removed.  The light pipe and vitreous cutter were inserted into the vitreous cavity and vitrectomy was used to remove remaining silicone oil.  Endolaser was applied 200 degrees to the periphery to better support the retina.  A complete air/fluid exchange was performed and any remaining silicone oil was removed.  The infusion was turned on to achieve a partial air/fill.  The superior cannulas were sequentially removed and the superotemporal sclertomy was closed with 8-0 Vicryl.  The infusion line and trocar were removed and the sclerotomy similarly closed with 8-0 Vicryl.  Conjunctiva was reapproximated to the limbus.  Subconjunctival injections of  Dexamethasone 4mg /52ml was placed in the infero-medial quadrant.   The speculum and drapes were removed and the eye was patched with Polymixin/Bacitracin ophthalmic ointment. An eye shield was placed and the patient was transferred alert and conversant with stable vital signs to the post operative recovery area.  The patient tolerated the procedure well and no complications were noted.  0m MD

## 2020-08-19 NOTE — H&P (Signed)
Date of examination:  08/19/20  Indication for surgery: retained silicone oil right eye  Pertinent past medical history:  Past Medical History:  Diagnosis Date  . ADHD   . Anemia   . Anxiety   . Depression   . 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 Depression Mother   . GER disease Mother   . Depression Father   . Insomnia Father   . Heart disease Maternal Grandmother   . Heart disease Maternal Grandfather   . Cancer Maternal Grandfather        lung cancer  . Diabetes Paternal Grandmother     General:  Healthy appearing patient in no distress.    Eyes:    Acuity OD 20/100  External: Within normal limits      Anterior segment: Within normal limits          Fundus: attached, good peripheral laser scars OU       Impression: retained silicone oil right eye  Plan:  Vitrectomy with possible endolaser/gas tamponade right eye  Carmela Rima, MD

## 2020-08-19 NOTE — Transfer of Care (Signed)
Immediate Anesthesia Transfer of Care Note  Patient: Shelly Hill  Procedure(s) Performed: REMOVAL OF SILICONE OIL, PARS PLANA VITRECTOMY WITH 25 GAUGE AND ENDOLASER (Right Eye)  Patient Location: PACU   Anesthesia Type:MAC  Level of Consciousness: awake, alert , oriented and patient cooperative  Airway & Oxygen Therapy: [place] room air  Post-op Assessment: Report given to RN and Post -op Vital signs reviewed and stable  Post vital signs: Reviewed and stable  Last Vitals:  Vitals Value Taken Time  BP    Temp    Pulse 70 08/19/20 2106  Resp 9 08/19/20 2106  SpO2 97 % 08/19/20 2106  Vitals shown include unvalidated device data.  Last Pain:  Vitals:   08/19/20 1343  TempSrc:   PainSc: 0-No pain         Complications: No complications documented.

## 2020-08-19 NOTE — Progress Notes (Signed)
Patient notified of delay comfort measures offered.

## 2020-08-19 NOTE — Anesthesia Procedure Notes (Signed)
Procedure Name: MAC Date/Time: 08/19/2020 7:36 PM Performed by: Oletta Lamas, CRNA Pre-anesthesia Checklist: Patient identified, Emergency Drugs available, Suction available, Patient being monitored and Timeout performed Patient Re-evaluated:Patient Re-evaluated prior to induction Oxygen Delivery Method: Nasal cannula

## 2020-08-19 NOTE — Brief Op Note (Signed)
08/19/2020  9:06 PM  PATIENT:  Shelly Hill  30 y.o. female  PRE-OPERATIVE DIAGNOSIS:  Retained silicone oil right eye and history of retinal detachment right eye  POST-OPERATIVE DIAGNOSIS: Retained silicone oil right eye and history of retinal detachment right eye  PROCEDURE:  Procedure(s): REMOVAL OF SILICONE OIL, PARS PLANA VITRECTOMY WITH 25 GAUGE AND ENDOLASER (Right)  SURGEON:  Surgeon(s) and Role:    Jalene Mullet, MD - Primary  PHYSICIAN ASSISTANT:   ASSISTANTS: none   ANESTHESIA:   local and MAC  EBL:  minimal   BLOOD ADMINISTERED:none  DRAINS: none   LOCAL MEDICATIONS USED:  MARCAINE    and LIDOCAINE   SPECIMEN:  No Specimen  DISPOSITION OF SPECIMEN:  N/A  COUNTS:  YES  TOURNIQUET:  * No tourniquets in log *  DICTATION: .Note written in EPIC  PLAN OF CARE: Discharge to home after PACU  PATIENT DISPOSITION:  PACU - hemodynamically stable.   Delay start of Pharmacological VTE agent (>24hrs) due to surgical blood loss or risk of bleeding: not applicable

## 2020-08-19 NOTE — Anesthesia Preprocedure Evaluation (Addendum)
Anesthesia Evaluation  Patient identified by MRN, date of birth, ID band Patient awake    Reviewed: Allergy & Precautions, H&P , NPO status , Patient's Chart, lab work & pertinent test results  Airway Mallampati: II  TM Distance: >3 FB Neck ROM: Full    Dental no notable dental hx. (+) Teeth Intact, Dental Advisory Given   Pulmonary neg pulmonary ROS, former smoker,    Pulmonary exam normal breath sounds clear to auscultation       Cardiovascular negative cardio ROS   Rhythm:Regular Rate:Normal     Neuro/Psych Anxiety Depression negative neurological ROS     GI/Hepatic negative GI ROS, Neg liver ROS,   Endo/Other  negative endocrine ROS  Renal/GU negative Renal ROS  negative genitourinary   Musculoskeletal   Abdominal   Peds  Hematology  (+) Blood dyscrasia, anemia ,   Anesthesia Other Findings   Reproductive/Obstetrics negative OB ROS                            Anesthesia Physical Anesthesia Plan  ASA: II  Anesthesia Plan: MAC   Post-op Pain Management:    Induction: Intravenous  PONV Risk Score and Plan: 3 and Midazolam and Ondansetron  Airway Management Planned: Simple Face Mask  Additional Equipment:   Intra-op Plan:   Post-operative Plan:   Informed Consent: I have reviewed the patients History and Physical, chart, labs and discussed the procedure including the risks, benefits and alternatives for the proposed anesthesia with the patient or authorized representative who has indicated his/her understanding and acceptance.     Dental advisory given  Plan Discussed with: CRNA and Anesthesiologist  Anesthesia Plan Comments:        Anesthesia Quick Evaluation

## 2020-08-20 ENCOUNTER — Encounter (HOSPITAL_COMMUNITY): Payer: Self-pay | Admitting: Ophthalmology

## 2020-08-21 NOTE — Anesthesia Postprocedure Evaluation (Signed)
Anesthesia Post Note  Patient: Shelly Hill  Procedure(s) Performed: REMOVAL OF SILICONE OIL, PARS PLANA VITRECTOMY WITH 25 GAUGE AND ENDOLASER (Right Eye)     Patient location during evaluation: PACU Anesthesia Type: MAC Level of consciousness: awake Pain management: pain level controlled Vital Signs Assessment: post-procedure vital signs reviewed and stable Respiratory status: spontaneous breathing Cardiovascular status: stable Postop Assessment: adequate PO intake Anesthetic complications: no   No complications documented.  Last Vitals:  Vitals:   08/19/20 2118 08/19/20 2133  BP: 109/73 116/81  Pulse: 68 74  Resp: 18 14  Temp:  (!) 36.3 C  SpO2: 96% 100%    Last Pain:  Vitals:   08/19/20 2133  TempSrc:   PainSc: 0-No pain                 CHARLENE GREEN

## 2020-08-28 ENCOUNTER — Other Ambulatory Visit: Payer: Self-pay

## 2020-08-28 ENCOUNTER — Encounter (HOSPITAL_COMMUNITY): Payer: Self-pay | Admitting: Ophthalmology

## 2020-08-28 ENCOUNTER — Encounter (HOSPITAL_COMMUNITY)
Admission: RE | Disposition: A | Discharge status: Home / Self Care | Payer: Self-pay | Source: Home / Self Care | Attending: Ophthalmology

## 2020-08-28 ENCOUNTER — Ambulatory Visit (HOSPITAL_COMMUNITY): Payer: 59 | Admitting: Anesthesiology

## 2020-08-28 ENCOUNTER — Ambulatory Visit (HOSPITAL_COMMUNITY)
Admission: RE | Admit: 2020-08-28 | Discharge: 2020-08-28 | Disposition: A | Discharge status: Home / Self Care | Payer: 59 | Attending: Ophthalmology | Admitting: Ophthalmology

## 2020-08-28 DIAGNOSIS — Z20822 Contact with and (suspected) exposure to covid-19: Secondary | ICD-10-CM | POA: Diagnosis not present

## 2020-08-28 DIAGNOSIS — H33021 Retinal detachment with multiple breaks, right eye: Secondary | ICD-10-CM | POA: Diagnosis present

## 2020-08-28 DIAGNOSIS — Z87891 Personal history of nicotine dependence: Secondary | ICD-10-CM | POA: Insufficient documentation

## 2020-08-28 HISTORY — PX: RETINAL TEAR REPAIR CRYOTHERAPY: SHX5304

## 2020-08-28 HISTORY — PX: PARS PLANA VITRECTOMY: SHX2166

## 2020-08-28 LAB — POCT PREGNANCY, URINE: Preg Test, Ur: NEGATIVE

## 2020-08-28 LAB — SARS CORONAVIRUS 2 BY RT PCR (HOSPITAL ORDER, PERFORMED IN ~~LOC~~ HOSPITAL LAB): SARS Coronavirus 2: NEGATIVE

## 2020-08-28 SURGERY — PARS PLANA VITRECTOMY WITH 25 GAUGE
Anesthesia: Monitor Anesthesia Care | Site: Eye | Laterality: Right

## 2020-08-28 MED ORDER — SODIUM CHLORIDE 0.9 % IV SOLN: INTRAVENOUS | Status: DC

## 2020-08-28 MED ORDER — SUCCINYLCHOLINE CHLORIDE 200 MG/10ML IV SOSY
PREFILLED_SYRINGE | INTRAVENOUS | Status: AC
  Filled 2020-08-28: qty 10

## 2020-08-28 MED ORDER — DEXAMETHASONE SODIUM PHOSPHATE 10 MG/ML IJ SOLN
INTRAMUSCULAR | Status: DC | PRN
  Administered 2020-08-28: 10 mg

## 2020-08-28 MED ORDER — MIDAZOLAM HCL 5 MG/5ML IJ SOLN
INTRAMUSCULAR | Status: DC | PRN
  Administered 2020-08-28 (×2): 1 mg via INTRAVENOUS
  Administered 2020-08-28: 2 mg via INTRAVENOUS

## 2020-08-28 MED ORDER — PROPOFOL 500 MG/50ML IV EMUL
INTRAVENOUS | Status: DC | PRN
  Administered 2020-08-28: 50 ug/kg/min via INTRAVENOUS

## 2020-08-28 MED ORDER — PHENYLEPHRINE 40 MCG/ML (10ML) SYRINGE FOR IV PUSH (FOR BLOOD PRESSURE SUPPORT)
PREFILLED_SYRINGE | INTRAVENOUS | Status: AC
  Filled 2020-08-28: qty 20

## 2020-08-28 MED ORDER — MIDAZOLAM HCL 2 MG/2ML IJ SOLN
INTRAMUSCULAR | Status: AC
  Filled 2020-08-28: qty 2

## 2020-08-28 MED ORDER — TRIAMCINOLONE ACETONIDE 40 MG/ML IJ SUSP
INTRAMUSCULAR | Status: AC
  Filled 2020-08-28: qty 5

## 2020-08-28 MED ORDER — LIDOCAINE HCL 2 % IJ SOLN
INTRAMUSCULAR | Status: AC
  Filled 2020-08-28: qty 20

## 2020-08-28 MED ORDER — EPINEPHRINE PF 1 MG/ML IJ SOLN: INTRAOCULAR | Status: DC | PRN

## 2020-08-28 MED ORDER — ATROPINE SULFATE 1 % OP SOLN
OPHTHALMIC | Status: AC
  Filled 2020-08-28: qty 5

## 2020-08-28 MED ORDER — LIDOCAINE HCL (PF) 2 % IJ SOLN
INTRAMUSCULAR | Status: DC | PRN
  Administered 2020-08-28: 10 mL

## 2020-08-28 MED ORDER — POLYMYXIN B SULFATE 500000 UNITS IJ SOLR
INTRAMUSCULAR | Status: AC
  Filled 2020-08-28: qty 500000

## 2020-08-28 MED ORDER — HEPARIN SODIUM (PORCINE) 1000 UNIT/ML IJ SOLN
INTRAMUSCULAR | Status: AC
  Filled 2020-08-28: qty 2

## 2020-08-28 MED ORDER — FENTANYL CITRATE (PF) 100 MCG/2ML IJ SOLN: 25.0000 ug | INTRAMUSCULAR | Status: DC | PRN

## 2020-08-28 MED ORDER — BUPIVACAINE HCL (PF) 0.75 % IJ SOLN
INTRAMUSCULAR | Status: DC | PRN
  Administered 2020-08-28: 10 mL

## 2020-08-28 MED ORDER — DEXAMETHASONE SODIUM PHOSPHATE 10 MG/ML IJ SOLN
INTRAMUSCULAR | Status: AC
  Filled 2020-08-28: qty 1

## 2020-08-28 MED ORDER — PROPOFOL 10 MG/ML IV BOLUS
INTRAVENOUS | Status: DC | PRN
  Administered 2020-08-28: 20 mg via INTRAVENOUS
  Administered 2020-08-28: 25 mg via INTRAVENOUS
  Administered 2020-08-28 (×2): 20 mg via INTRAVENOUS
  Administered 2020-08-28: 40 mg via INTRAVENOUS

## 2020-08-28 MED ORDER — OXYCODONE HCL 5 MG/5ML PO SOLN: 5.0000 mg | Freq: Once | ORAL | Status: DC | PRN

## 2020-08-28 MED ORDER — STERILE WATER FOR INJECTION IJ SOLN
INTRAMUSCULAR | Status: AC
  Filled 2020-08-28: qty 10

## 2020-08-28 MED ORDER — BUPIVACAINE HCL (PF) 0.75 % IJ SOLN
INTRAMUSCULAR | Status: AC
  Filled 2020-08-28: qty 10

## 2020-08-28 MED ORDER — AMISULPRIDE (ANTIEMETIC) 5 MG/2ML IV SOLN: 10.0000 mg | Freq: Once | INTRAVENOUS | Status: DC | PRN

## 2020-08-28 MED ORDER — PHENYLEPHRINE HCL (PRESSORS) 10 MG/ML IV SOLN
INTRAVENOUS | Status: AC
  Filled 2020-08-28: qty 2

## 2020-08-28 MED ORDER — PROTAMINE SULFATE 10 MG/ML IV SOLN
INTRAVENOUS | Status: AC
  Filled 2020-08-28: qty 5

## 2020-08-28 MED ORDER — HYALURONIDASE HUMAN NICU 150 UNIT/ML INJECTION
INTRAMUSCULAR | Status: DC | PRN
  Administered 2020-08-28: 150 [IU]

## 2020-08-28 MED ORDER — FENTANYL CITRATE (PF) 250 MCG/5ML IJ SOLN
INTRAMUSCULAR | Status: AC
  Filled 2020-08-28: qty 5

## 2020-08-28 MED ORDER — ROCURONIUM BROMIDE 10 MG/ML (PF) SYRINGE
PREFILLED_SYRINGE | INTRAVENOUS | Status: AC
  Filled 2020-08-28: qty 40

## 2020-08-28 MED ORDER — ORAL CARE MOUTH RINSE: 15.0000 mL | Freq: Once | OROMUCOSAL | Status: DC

## 2020-08-28 MED ORDER — OXYCODONE HCL 5 MG PO TABS: 5.0000 mg | ORAL_TABLET | Freq: Once | ORAL | Status: DC | PRN

## 2020-08-28 MED ORDER — HYPROMELLOSE (GONIOSCOPIC) 2.5 % OP SOLN
OPHTHALMIC | Status: AC
  Filled 2020-08-28: qty 15

## 2020-08-28 MED ORDER — FENTANYL CITRATE (PF) 250 MCG/5ML IJ SOLN
INTRAMUSCULAR | Status: DC | PRN
  Administered 2020-08-28 (×4): 50 ug via INTRAVENOUS

## 2020-08-28 MED ORDER — ONDANSETRON HCL 4 MG/2ML IJ SOLN
INTRAMUSCULAR | Status: AC
  Filled 2020-08-28: qty 6

## 2020-08-28 MED ORDER — EPHEDRINE 5 MG/ML INJ
INTRAVENOUS | Status: AC
  Filled 2020-08-28: qty 10

## 2020-08-28 MED ORDER — PHENYLEPHRINE HCL 2.5 % OP SOLN
1.0000 [drp] | OPHTHALMIC | Status: AC | PRN
  Administered 2020-08-28 (×3): 1 [drp] via OPHTHALMIC
  Filled 2020-08-28: qty 2

## 2020-08-28 MED ORDER — PROPOFOL 10 MG/ML IV BOLUS
INTRAVENOUS | Status: AC
  Filled 2020-08-28: qty 20

## 2020-08-28 MED ORDER — SODIUM CHLORIDE (PF) 0.9 % IJ SOLN
INTRAMUSCULAR | Status: AC
  Filled 2020-08-28: qty 10

## 2020-08-28 MED ORDER — BSS IO SOLN
INTRAOCULAR | Status: AC
  Filled 2020-08-28: qty 15

## 2020-08-28 MED ORDER — 0.9 % SODIUM CHLORIDE (POUR BTL) OPTIME
TOPICAL | Status: DC | PRN
  Administered 2020-08-28: 1000 mL

## 2020-08-28 MED ORDER — PROPARACAINE HCL 0.5 % OP SOLN
1.0000 [drp] | OPHTHALMIC | Status: AC | PRN
  Administered 2020-08-28 (×3): 1 [drp] via OPHTHALMIC
  Filled 2020-08-28: qty 15

## 2020-08-28 MED ORDER — ONDANSETRON HCL 4 MG/2ML IJ SOLN: 4.0000 mg | Freq: Once | INTRAMUSCULAR | Status: DC | PRN

## 2020-08-28 MED ORDER — TOBRAMYCIN-DEXAMETHASONE 0.3-0.1 % OP OINT
TOPICAL_OINTMENT | OPHTHALMIC | Status: DC | PRN
  Administered 2020-08-28: 1 via OPHTHALMIC

## 2020-08-28 MED ORDER — CEFAZOLIN SODIUM 1 G IJ SOLR
INTRAMUSCULAR | Status: AC
  Filled 2020-08-28: qty 20

## 2020-08-28 MED ORDER — CEFAZOLIN SUBCONJUNCTIVAL INJECTION 100 MG/0.5 ML
100.0000 mg | INJECTION | SUBCONJUNCTIVAL | Status: AC
  Administered 2020-08-28: 100 mg via SUBCONJUNCTIVAL
  Filled 2020-08-28: qty 1

## 2020-08-28 MED ORDER — BRILLIANT BLUE G 0.025 % IO SOSY
0.5000 mL | PREFILLED_SYRINGE | INTRAOCULAR | Status: DC
  Filled 2020-08-28: qty 0.5

## 2020-08-28 MED ORDER — HYPROMELLOSE (GONIOSCOPIC) 2.5 % OP SOLN
OPHTHALMIC | Status: DC | PRN
  Administered 2020-08-28: 1 [drp] via OPHTHALMIC

## 2020-08-28 MED ORDER — BSS PLUS IO SOLN
INTRAOCULAR | Status: AC
  Filled 2020-08-28: qty 500

## 2020-08-28 MED ORDER — CYCLOPENTOLATE HCL 1 % OP SOLN
1.0000 [drp] | OPHTHALMIC | Status: AC | PRN
  Administered 2020-08-28 (×3): 1 [drp] via OPHTHALMIC
  Filled 2020-08-28: qty 2

## 2020-08-28 MED ORDER — TOBRAMYCIN-DEXAMETHASONE 0.3-0.1 % OP OINT
TOPICAL_OINTMENT | OPHTHALMIC | Status: AC
  Filled 2020-08-28: qty 3.5

## 2020-08-28 MED ORDER — LIDOCAINE 2% (20 MG/ML) 5 ML SYRINGE
INTRAMUSCULAR | Status: AC
  Filled 2020-08-28: qty 15

## 2020-08-28 MED ORDER — HYALURONIDASE HUMAN 150 UNIT/ML IJ SOLN
INTRAMUSCULAR | Status: AC
  Filled 2020-08-28: qty 1

## 2020-08-28 MED ORDER — INDOCYANINE GREEN 25 MG IV SOLR
INTRAVENOUS | Status: AC
  Filled 2020-08-28: qty 10

## 2020-08-28 MED ORDER — CHLORHEXIDINE GLUCONATE 0.12 % MT SOLN
15.0000 mL | Freq: Once | OROMUCOSAL | Status: DC
  Filled 2020-08-28: qty 15

## 2020-08-28 MED ORDER — DEXAMETHASONE SODIUM PHOSPHATE 10 MG/ML IJ SOLN
INTRAMUSCULAR | Status: AC
  Filled 2020-08-28: qty 2

## 2020-08-28 MED ORDER — ONDANSETRON HCL 4 MG/2ML IJ SOLN
INTRAMUSCULAR | Status: DC | PRN
  Administered 2020-08-28: 4 mg via INTRAVENOUS

## 2020-08-28 MED ORDER — EPINEPHRINE PF 1 MG/ML IJ SOLN
INTRAMUSCULAR | Status: AC
  Filled 2020-08-28: qty 1

## 2020-08-28 MED ORDER — CEFTAZIDIME 1 G IJ SOLR
INTRAMUSCULAR | Status: AC
  Filled 2020-08-28: qty 1

## 2020-08-28 MED ORDER — TETRACAINE HCL 0.5 % OP SOLN
OPHTHALMIC | Status: AC
  Filled 2020-08-28: qty 4

## 2020-08-28 MED ORDER — ACETAZOLAMIDE SODIUM 500 MG IJ SOLR
INTRAMUSCULAR | Status: AC
  Filled 2020-08-28: qty 500

## 2020-08-28 SURGICAL SUPPLY — 78 items
APPLICATOR COTTON TIP 6 STRL (MISCELLANEOUS) ×2 IMPLANT
APPLICATOR COTTON TIP 6IN STRL (MISCELLANEOUS) ×3
APPLICATOR DR MATTHEWS STRL (MISCELLANEOUS) ×12 IMPLANT
BAND WRIST GAS GREEN (MISCELLANEOUS) IMPLANT
BLADE EYE CATARACT 19 1.4 BEAV (BLADE) IMPLANT
BLADE MVR KNIFE 19G (BLADE) IMPLANT
BLADE MVR KNIFE 20G (BLADE) IMPLANT
BLADE STAB KNIFE 15DEG (BLADE) IMPLANT
BNDG EYE OVAL (GAUZE/BANDAGES/DRESSINGS) IMPLANT
CABLE BIPOLOR RESECTION CORD (MISCELLANEOUS) ×3 IMPLANT
CANNULA ANT CHAM MAIN (OPHTHALMIC RELATED) IMPLANT
CANNULA DUAL BORE 23G (CANNULA) IMPLANT
CANNULA DUALBORE 25G (CANNULA) IMPLANT
CANNULA VLV SOFT TIP 25GA (OPHTHALMIC) ×3 IMPLANT
CAUTERY EYE LOW TEMP 1300F FIN (OPHTHALMIC RELATED) IMPLANT
CLSR STERI-STRIP ANTIMIC 1/2X4 (GAUZE/BANDAGES/DRESSINGS) ×3 IMPLANT
COTTONBALL LRG STERILE PKG (GAUZE/BANDAGES/DRESSINGS) ×9 IMPLANT
COVER MAYO STAND STRL (DRAPES) IMPLANT
COVER SURGICAL LIGHT HANDLE (MISCELLANEOUS) ×3 IMPLANT
COVER WAND RF STERILE (DRAPES) ×3 IMPLANT
DRAPE HALF SHEET 40X57 (DRAPES) ×3 IMPLANT
DRAPE INCISE 51X51 W/FILM STRL (DRAPES) ×3 IMPLANT
DRAPE RETRACTOR (MISCELLANEOUS) ×3 IMPLANT
ERASER HMR WETFIELD 23G BP (MISCELLANEOUS) IMPLANT
FILTER STRAW FLUID ASPIR (MISCELLANEOUS) IMPLANT
FORCEPS ECKARDT ILM 25G SERR (OPHTHALMIC RELATED) IMPLANT
FORCEPS GRIESHABER ILM 25G A (INSTRUMENTS) IMPLANT
GAS AUTO FILL CONSTEL (OPHTHALMIC)
GAS AUTO FILL CONSTELLATION (OPHTHALMIC) IMPLANT
GAS WRIST BAND GREEN (MISCELLANEOUS)
GLOVE SURG SYN 7.5  E (GLOVE) ×1
GLOVE SURG SYN 7.5 E (GLOVE) ×2 IMPLANT
GOWN STRL REUS W/ TWL LRG LVL3 (GOWN DISPOSABLE) ×2 IMPLANT
GOWN STRL REUS W/TWL LRG LVL3 (GOWN DISPOSABLE) ×1
KIT BASIN OR (CUSTOM PROCEDURE TRAY) ×3 IMPLANT
KIT TURNOVER KIT B (KITS) ×3 IMPLANT
LENS BIOM SUPER VIEW SET DISP (MISCELLANEOUS) ×3 IMPLANT
MICROPICK 25G (MISCELLANEOUS)
NEEDLE 18GX1X1/2 (RX/OR ONLY) (NEEDLE) ×3 IMPLANT
NEEDLE 25GX 5/8IN NON SAFETY (NEEDLE) ×3 IMPLANT
NEEDLE FILTER BLUNT 18X 1/2SAF (NEEDLE) ×1
NEEDLE FILTER BLUNT 18X1 1/2 (NEEDLE) ×2 IMPLANT
NEEDLE HYPO 25GX1X1/2 BEV (NEEDLE) ×3 IMPLANT
NEEDLE HYPO 30X.5 LL (NEEDLE) ×6 IMPLANT
NEEDLE RETROBULBAR 25GX1.5 (NEEDLE) ×3 IMPLANT
NS IRRIG 1000ML POUR BTL (IV SOLUTION) ×3 IMPLANT
OIL SILICONE OPHTHALMIC 1000 (Ophthalmic Related) ×3 IMPLANT
PACK FRAGMATOME (OPHTHALMIC) IMPLANT
PACK VITRECTOMY CUSTOM (CUSTOM PROCEDURE TRAY) ×3 IMPLANT
PAD ARMBOARD 7.5X6 YLW CONV (MISCELLANEOUS) ×6 IMPLANT
PAK PIK VITRECTOMY CVS 25GA (OPHTHALMIC) ×3 IMPLANT
PENCIL BIPOLAR 25GA STR DISP (OPHTHALMIC RELATED) ×3 IMPLANT
PICK MICROPICK 25G (MISCELLANEOUS) IMPLANT
PROBE ENDO DIATHERMY 25G (MISCELLANEOUS) ×3 IMPLANT
PROBE LASER ILLUM FLEX CVD 25G (OPHTHALMIC) IMPLANT
ROLLS DENTAL (MISCELLANEOUS) ×6 IMPLANT
SCRAPER DIAMOND 25GA (OPHTHALMIC RELATED) IMPLANT
SHEET MEDIUM DRAPE 40X70 STRL (DRAPES) ×3 IMPLANT
SOL ANTI FOG 6CC (MISCELLANEOUS) ×2 IMPLANT
SOLUTION ANTI FOG 6CC (MISCELLANEOUS) ×1
SPONGE SURGIFOAM ABS GEL 12-7 (HEMOSTASIS) IMPLANT
STOPCOCK 4 WAY LG BORE MALE ST (IV SETS) IMPLANT
SUT ETHILON 9 0 TG140 8 (SUTURE) IMPLANT
SUT MERSILENE 4 0 RV 2 (SUTURE) IMPLANT
SUT SILK 2 0 (SUTURE)
SUT SILK 2-0 18XBRD TIE 12 (SUTURE) IMPLANT
SUT SILK 4 0 RB 1 (SUTURE) IMPLANT
SUT VICRYL 7 0 TG140 8 (SUTURE) ×3 IMPLANT
SUT VICRYL 8 0 TG140 8 (SUTURE) ×3 IMPLANT
SYR 10ML LL (SYRINGE) ×3 IMPLANT
SYR 20ML LL LF (SYRINGE) ×3 IMPLANT
SYR 5ML LL (SYRINGE) ×3 IMPLANT
SYR BULB EAR ULCER 3OZ GRN STR (SYRINGE) ×3 IMPLANT
SYR TB 1ML LUER SLIP (SYRINGE) IMPLANT
TOWEL GREEN STERILE FF (TOWEL DISPOSABLE) ×3 IMPLANT
TUBING HIGH PRESS EXTEN 6IN (TUBING) IMPLANT
WATER STERILE IRR 1000ML POUR (IV SOLUTION) ×3 IMPLANT
WIPE INSTRUMENT VISIWIPE 73X73 (MISCELLANEOUS) IMPLANT

## 2020-08-28 NOTE — H&P (Signed)
  Date of examination:  08/29/20  Indication for surgery: retinal detachment right eye  Pertinent past medical history:  Past Medical History:  Diagnosis Date  . ADHD   . Anemia   . Anxiety   . Depression   . 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 Depression Mother   . GER disease Mother   . Depression Father   . Insomnia Father   . Heart disease Maternal Grandmother   . Heart disease Maternal Grandfather   . Cancer Maternal Grandfather        lung cancer  . Diabetes Paternal Grandmother     General:  Healthy appearing patient in no distress.    Eyes:    Acuity OD 20/200    External: Within normal limits     Anterior segment: Within normal limits       Fundus: retinal detachment right eye    Impression: retinal detachment right eye  Plan: retinal detachment repair right eye  Carmela Rima, MD

## 2020-08-28 NOTE — Anesthesia Postprocedure Evaluation (Signed)
Anesthesia Post Note  Patient: Shelly Hill  Procedure(s) Performed: PARS PLANA VITRECTOMY WITH 25 GAUGE (Right Eye) RETINAL TEAR REPAIR (Right Eye)     Patient location during evaluation: PACU Anesthesia Type: MAC Level of consciousness: awake and alert Pain management: pain level controlled Vital Signs Assessment: post-procedure vital signs reviewed and stable Respiratory status: spontaneous breathing and respiratory function stable Cardiovascular status: stable Postop Assessment: no apparent nausea or vomiting Anesthetic complications: no   No complications documented.  Last Vitals:  Vitals:   08/28/20 1430 08/28/20 1445  BP: 120/72 117/72  Pulse: 75 84  Resp: 10 17  Temp: 36.8 C   SpO2: 99% 96%    Last Pain:  Vitals:   08/28/20 1430  TempSrc:   PainSc: 3                  Candra R Rorik Vespa

## 2020-08-28 NOTE — Anesthesia Procedure Notes (Signed)
Procedure Name: MAC Date/Time: 08/28/2020 1:26 PM Performed by: Imagene Riches, CRNA Pre-anesthesia Checklist: Patient identified, Emergency Drugs available, Suction available, Patient being monitored and Timeout performed Patient Re-evaluated:Patient Re-evaluated prior to induction Oxygen Delivery Method: Nasal cannula

## 2020-08-28 NOTE — Transfer of Care (Signed)
Immediate Anesthesia Transfer of Care Note  Patient: Shelly Hill  Procedure(s) Performed: PARS PLANA VITRECTOMY WITH 25 GAUGE (Right Eye) RETINAL TEAR REPAIR (Right Eye)  Patient Location: PACU  Anesthesia Type:MAC  Level of Consciousness: awake  Airway & Oxygen Therapy: Patient Spontanous Breathing and Patient connected to nasal cannula oxygen  Post-op Assessment: Report given to RN and Post -op Vital signs reviewed and stable  Post vital signs: Reviewed and stable  Last Vitals:  Vitals Value Taken Time  BP 120/72 08/28/20 1430  Temp 36.8 C 08/28/20 1430  Pulse 77 08/28/20 1434  Resp 12 08/28/20 1434  SpO2 98 % 08/28/20 1434  Vitals shown include unvalidated device data.  Last Pain:  Vitals:   08/28/20 1430  TempSrc:   PainSc: 3       Patients Stated Pain Goal: 3 (28/41/32 4401)  Complications: No complications documented.

## 2020-08-28 NOTE — Op Note (Signed)
Shanna Un 08/28/2020 Diagnosis: Retinal detachment with multiple breaks right eye  Procedure: Pars Plana Vitrectomy, Endolaser and retinectomy, air/fluid exchange, silicone oil tamponade. Operative Eye:  right eye  Surgeon: Harrold Donath Estimated Blood Loss: minimal Specimens for Pathology:  None Complications: none   The  patient was prepped and draped in the usual fashion for ocular surgery on the  right eye .  A lid speculum was placed.  A localized peritomy was made from 9 to 3 o'clock and an Infusion line and trocar was placed at the 9  o'clock position approximately 3.5 mm from the surgical limbus.   The infusion line was allowed to run and then clamped when placed at the cannula opening. The line was inserted and secured to the drape with an adhesive strip.   Active trocars/cannula were placed at the 10 and 2 o'clock positions approximately 3.5 mm from the surgical limbus. The cannula was visualized in the vitreous cavity.  Endocautery was used to delineate the fibrotic retina to relieve the retinal tension.  The vitrector was used the make a retinectomy,  A complete air/fluid exchange was performed and the macula flattened.  Endolaser was applied to the periphery.  Additional subretinal fluid was drained.  Silicone oil was placed in the eye to attain a near complete oil fill.     The superior cannulas were sequentially removed and sutured closed with 8-0 Vicryl.  The infusion line and trocar were removed and similarly closed.  The conjunctiva was reapproximated to the limbus.  Subconjunctival injections of Ancef and  Dexamethasone 4mg /36ml were placed in the infero-medial quadrant.   The speculum and drapes were removed and the eye was patched with Polymixin/Bacitracin ophthalmic ointment. An eye shield was placed and the patient was transferred alert and conversant with stable vital signs to the post operative recovery area.  The patient tolerated the procedure well and no  complications were noted.  0m MD

## 2020-08-28 NOTE — Brief Op Note (Signed)
08/28/2020  2:22 PM  PATIENT:  Shelly Hill  30 y.o. female  PRE-OPERATIVE DIAGNOSIS:  RETINAL DETACHMENT MULTIPLE BREAKS  POST-OPERATIVE DIAGNOSIS:  RETINAL DETACHMENT MULTIPLE BREAKS  PROCEDURE:  Procedure(s): RETINAL DETACHMENT REPAIR RIGHT EYE PARS PLANA VITRECTOMY WITH 25 GAUGE (Right) RETINECTOMY, ENDOCAUTERY, ENDOLASER, AIR/FLUID EXCHANGE, SILICONE OIL TAMPONADE (Right)  SURGEON:  Surgeon(s) and Role:    * Jalene Mullet, MD - Primary  PHYSICIAN ASSISTANT:   ASSISTANTS: none   ANESTHESIA:   local and MAC  EBL:  MINIMAL   BLOOD ADMINISTERED:none  DRAINS: none   LOCAL MEDICATIONS USED:  MARCAINE    and LIDOCAINE   SPECIMEN:  No Specimen  DISPOSITION OF SPECIMEN:  N/A  COUNTS:  YES  TOURNIQUET:  * No tourniquets in log *  DICTATION: .Note written in EPIC  PLAN OF CARE: Discharge to home after PACU  PATIENT DISPOSITION:  PACU - hemodynamically stable.   Delay start of Pharmacological VTE agent (>24hrs) due to surgical blood loss or risk of bleeding: not applicable

## 2020-08-28 NOTE — Discharge Instructions (Addendum)
DO NOT SLEEP ON BACK, THE EYE PRESSURE CAN GO UP AND CAUSE VISION LOSS  ° °SLEEP ON SIDE WITH NOSE TO PILLOW ° °DURING DAY KEEP FACE UPRIGHT °

## 2020-08-28 NOTE — Anesthesia Preprocedure Evaluation (Addendum)
Anesthesia Evaluation  Patient identified by MRN, date of birth, ID band Patient awake    Reviewed: Allergy & Precautions, H&P , NPO status , Patient's Chart, lab work & pertinent test results  Airway Mallampati: II  TM Distance: >3 FB Neck ROM: Full    Dental no notable dental hx. (+) Teeth Intact, Dental Advisory Given   Pulmonary neg pulmonary ROS, former smoker,    Pulmonary exam normal breath sounds clear to auscultation       Cardiovascular negative cardio ROS   Rhythm:Regular Rate:Normal     Neuro/Psych PSYCHIATRIC DISORDERS Anxiety Depression negative neurological ROS     GI/Hepatic negative GI ROS, Neg liver ROS,   Endo/Other  negative endocrine ROS  Renal/GU negative Renal ROS  negative genitourinary   Musculoskeletal   Abdominal   Peds  Hematology  (+) Blood dyscrasia, anemia ,   Anesthesia Other Findings   Reproductive/Obstetrics negative OB ROS                            Anesthesia Physical  Anesthesia Plan  ASA: II  Anesthesia Plan: MAC   Post-op Pain Management:    Induction: Intravenous  PONV Risk Score and Plan: 3 and Midazolam and Ondansetron  Airway Management Planned: Simple Face Mask and Natural Airway  Additional Equipment: None  Intra-op Plan:   Post-operative Plan:   Informed Consent: I have reviewed the patients History and Physical, chart, labs and discussed the procedure including the risks, benefits and alternatives for the proposed anesthesia with the patient or authorized representative who has indicated his/her understanding and acceptance.     Dental advisory given  Plan Discussed with: CRNA and Anesthesiologist  Anesthesia Plan Comments:        Anesthesia Quick Evaluation

## 2020-08-29 ENCOUNTER — Encounter (HOSPITAL_COMMUNITY): Payer: Self-pay | Admitting: Ophthalmology

## 2020-10-09 ENCOUNTER — Other Ambulatory Visit: Payer: Self-pay | Admitting: Family Medicine

## 2020-10-09 DIAGNOSIS — Z309 Encounter for contraceptive management, unspecified: Secondary | ICD-10-CM

## 2020-11-21 ENCOUNTER — Other Ambulatory Visit: Payer: Self-pay

## 2020-11-21 ENCOUNTER — Encounter (HOSPITAL_COMMUNITY): Payer: Self-pay | Admitting: Ophthalmology

## 2020-11-21 NOTE — Progress Notes (Signed)
Unable to reach patient.  Patient's voicemail box is full.  Unable to [facility]  Called patient's mother Shelly Hill and no answer.  Left detailed message on machine with instruction for DOS for patient.    PCP - [facility] Cardiologist - n/a  Chest x-ray - n/a EKG - n/a Stress Test - n/a ECHO - n/a Cardiac Cath - n/a  ERAS: Clear liquids til 2:30 pm on DOS  STOP now taking any Aspirin (unless otherwise instructed by your surgeon), Aleve, Naproxen, Ibuprofen, Motrin, Advil, Goody's, BC's, all herbal medications, fish oil, and all vitamins.

## 2020-11-25 ENCOUNTER — Ambulatory Visit (HOSPITAL_COMMUNITY)
Admission: RE | Admit: 2020-11-25 | Discharge: 2020-11-25 | Disposition: A | Discharge status: Home / Self Care | Payer: 59 | Attending: Ophthalmology | Admitting: Ophthalmology

## 2020-11-25 ENCOUNTER — Encounter (HOSPITAL_COMMUNITY): Payer: Self-pay | Admitting: Ophthalmology

## 2020-11-25 ENCOUNTER — Other Ambulatory Visit: Payer: Self-pay

## 2020-11-25 ENCOUNTER — Encounter (HOSPITAL_COMMUNITY)
Admission: RE | Disposition: A | Discharge status: Home / Self Care | Payer: Self-pay | Source: Home / Self Care | Attending: Ophthalmology

## 2020-11-25 ENCOUNTER — Ambulatory Visit (HOSPITAL_COMMUNITY): Payer: 59 | Admitting: Anesthesiology

## 2020-11-25 DIAGNOSIS — H3321 Serous retinal detachment, right eye: Secondary | ICD-10-CM | POA: Diagnosis not present

## 2020-11-25 DIAGNOSIS — H35371 Puckering of macula, right eye: Secondary | ICD-10-CM | POA: Diagnosis not present

## 2020-11-25 HISTORY — PX: PHOTOCOAGULATION WITH LASER: SHX6027

## 2020-11-25 HISTORY — PX: SILICON OIL REMOVAL: SHX5305

## 2020-11-25 HISTORY — PX: GAS INSERTION: SHX5336

## 2020-11-25 HISTORY — PX: PARS PLANA VITRECTOMY: SHX2166

## 2020-11-25 HISTORY — PX: MEMBRANE PEEL: SHX5967

## 2020-11-25 LAB — CBC
HCT: 40.2 % (ref 36.0–46.0)
Hemoglobin: 13.9 g/dL (ref 12.0–15.0)
MCH: 31.6 pg (ref 26.0–34.0)
MCHC: 34.6 g/dL (ref 30.0–36.0)
MCV: 91.4 fL (ref 80.0–100.0)
Platelets: 229 10*3/uL (ref 150–400)
RBC: 4.4 MIL/uL (ref 3.87–5.11)
RDW: 11.6 % (ref 11.5–15.5)
WBC: 5.2 10*3/uL (ref 4.0–10.5)
nRBC: 0 % (ref 0.0–0.2)

## 2020-11-25 LAB — POCT PREGNANCY, URINE: Preg Test, Ur: NEGATIVE

## 2020-11-25 SURGERY — PARS PLANA VITRECTOMY WITH 25 GAUGE
Anesthesia: General | Site: Eye | Laterality: Right

## 2020-11-25 MED ORDER — HYPROMELLOSE (GONIOSCOPIC) 2.5 % OP SOLN
OPHTHALMIC | Status: DC | PRN
  Administered 2020-11-25: 2 [drp] via OPHTHALMIC

## 2020-11-25 MED ORDER — HYALURONIDASE HUMAN 150 UNIT/ML IJ SOLN
INTRAMUSCULAR | Status: AC
  Filled 2020-11-25: qty 1

## 2020-11-25 MED ORDER — OXYCODONE HCL 5 MG PO TABS: 5.0000 mg | ORAL_TABLET | Freq: Once | ORAL | Status: DC | PRN

## 2020-11-25 MED ORDER — SODIUM CHLORIDE 0.9 % IV SOLN: INTRAVENOUS | Status: DC

## 2020-11-25 MED ORDER — TOBRAMYCIN-DEXAMETHASONE 0.3-0.1 % OP OINT
TOPICAL_OINTMENT | OPHTHALMIC | Status: DC | PRN
  Administered 2020-11-25: 1 via OPHTHALMIC

## 2020-11-25 MED ORDER — MIDAZOLAM HCL 2 MG/2ML IJ SOLN
INTRAMUSCULAR | Status: AC
  Filled 2020-11-25: qty 2

## 2020-11-25 MED ORDER — LIDOCAINE HCL 2 % IJ SOLN
INTRAMUSCULAR | Status: AC
  Filled 2020-11-25: qty 20

## 2020-11-25 MED ORDER — BSS PLUS IO SOLN
INTRAOCULAR | Status: AC
  Filled 2020-11-25: qty 500

## 2020-11-25 MED ORDER — FENTANYL CITRATE (PF) 100 MCG/2ML IJ SOLN: 25.0000 ug | INTRAMUSCULAR | Status: DC | PRN

## 2020-11-25 MED ORDER — BRILLIANT BLUE G 0.025 % IO SOSY
0.5000 mL | PREFILLED_SYRINGE | INTRAOCULAR | Status: DC
  Filled 2020-11-25: qty 0.5

## 2020-11-25 MED ORDER — CEFAZOLIN SUBCONJUNCTIVAL INJECTION 100 MG/0.5 ML
INJECTION | SUBCONJUNCTIVAL | Status: DC | PRN
  Administered 2020-11-25: 100 mg via SUBCONJUNCTIVAL

## 2020-11-25 MED ORDER — PROPOFOL 10 MG/ML IV BOLUS
INTRAVENOUS | Status: DC | PRN
  Administered 2020-11-25: 150 mg via INTRAVENOUS

## 2020-11-25 MED ORDER — HYPROMELLOSE (GONIOSCOPIC) 2.5 % OP SOLN
OPHTHALMIC | Status: AC
  Filled 2020-11-25: qty 15

## 2020-11-25 MED ORDER — BSS IO SOLN
INTRAOCULAR | Status: AC
  Filled 2020-11-25: qty 15

## 2020-11-25 MED ORDER — ONDANSETRON HCL 4 MG/2ML IJ SOLN
INTRAMUSCULAR | Status: AC
  Filled 2020-11-25: qty 2

## 2020-11-25 MED ORDER — SUCCINYLCHOLINE CHLORIDE 200 MG/10ML IV SOSY
PREFILLED_SYRINGE | INTRAVENOUS | Status: AC
  Filled 2020-11-25: qty 10

## 2020-11-25 MED ORDER — ATROPINE SULFATE 1 % OP SOLN
OPHTHALMIC | Status: AC
  Filled 2020-11-25: qty 5

## 2020-11-25 MED ORDER — ONDANSETRON HCL 4 MG/2ML IJ SOLN
4.0000 mg | Freq: Once | INTRAMUSCULAR | Status: AC | PRN
  Administered 2020-11-25: 4 mg via INTRAVENOUS

## 2020-11-25 MED ORDER — FENTANYL CITRATE (PF) 250 MCG/5ML IJ SOLN
INTRAMUSCULAR | Status: DC | PRN
  Administered 2020-11-25 (×2): 100 ug via INTRAVENOUS
  Administered 2020-11-25: 50 ug via INTRAVENOUS

## 2020-11-25 MED ORDER — CYCLOPENTOLATE HCL 1 % OP SOLN
1.0000 [drp] | OPHTHALMIC | Status: AC | PRN
  Administered 2020-11-25 (×3): 1 [drp] via OPHTHALMIC
  Filled 2020-11-25: qty 2

## 2020-11-25 MED ORDER — LIDOCAINE 2% (20 MG/ML) 5 ML SYRINGE
INTRAMUSCULAR | Status: DC | PRN
  Administered 2020-11-25: 100 mg via INTRAVENOUS

## 2020-11-25 MED ORDER — TOBRAMYCIN-DEXAMETHASONE 0.3-0.1 % OP OINT
TOPICAL_OINTMENT | OPHTHALMIC | Status: AC
  Filled 2020-11-25: qty 3.5

## 2020-11-25 MED ORDER — ORAL CARE MOUTH RINSE: 15.0000 mL | Freq: Once | OROMUCOSAL | Status: AC

## 2020-11-25 MED ORDER — BUPIVACAINE HCL (PF) 0.75 % IJ SOLN
INTRAMUSCULAR | Status: AC
  Filled 2020-11-25: qty 10

## 2020-11-25 MED ORDER — ROCURONIUM BROMIDE 10 MG/ML (PF) SYRINGE
PREFILLED_SYRINGE | INTRAVENOUS | Status: DC | PRN
  Administered 2020-11-25: 40 mg via INTRAVENOUS
  Administered 2020-11-25: 60 mg via INTRAVENOUS

## 2020-11-25 MED ORDER — ROCURONIUM BROMIDE 10 MG/ML (PF) SYRINGE
PREFILLED_SYRINGE | INTRAVENOUS | Status: AC
  Filled 2020-11-25: qty 10

## 2020-11-25 MED ORDER — PHENYLEPHRINE HCL 2.5 % OP SOLN
1.0000 [drp] | OPHTHALMIC | Status: AC | PRN
  Administered 2020-11-25 (×3): 1 [drp] via OPHTHALMIC
  Filled 2020-11-25: qty 2

## 2020-11-25 MED ORDER — MIDAZOLAM HCL 2 MG/2ML IJ SOLN
INTRAMUSCULAR | Status: DC | PRN
  Administered 2020-11-25: 2 mg via INTRAVENOUS

## 2020-11-25 MED ORDER — TETRACAINE HCL 0.5 % OP SOLN
OPHTHALMIC | Status: AC
  Filled 2020-11-25: qty 4

## 2020-11-25 MED ORDER — ONDANSETRON HCL 4 MG/2ML IJ SOLN
INTRAMUSCULAR | Status: DC | PRN
  Administered 2020-11-25: 4 mg via INTRAVENOUS

## 2020-11-25 MED ORDER — OFLOXACIN 0.3 % OP SOLN
1.0000 [drp] | OPHTHALMIC | Status: AC | PRN
  Administered 2020-11-25 (×3): 1 [drp] via OPHTHALMIC
  Filled 2020-11-25: qty 5

## 2020-11-25 MED ORDER — LIDOCAINE 2% (20 MG/ML) 5 ML SYRINGE
INTRAMUSCULAR | Status: AC
  Filled 2020-11-25: qty 5

## 2020-11-25 MED ORDER — PROPOFOL 10 MG/ML IV BOLUS
INTRAVENOUS | Status: AC
  Filled 2020-11-25: qty 20

## 2020-11-25 MED ORDER — EPINEPHRINE PF 1 MG/ML IJ SOLN
INTRAOCULAR | Status: DC | PRN
  Administered 2020-11-25: 500.3 mL

## 2020-11-25 MED ORDER — DEXAMETHASONE SODIUM PHOSPHATE 10 MG/ML IJ SOLN
INTRAMUSCULAR | Status: DC | PRN
  Administered 2020-11-25: 10 mg

## 2020-11-25 MED ORDER — LIDOCAINE HCL 2 % IJ SOLN
INTRAMUSCULAR | Status: DC | PRN
  Administered 2020-11-25: 3 mL via RETROBULBAR

## 2020-11-25 MED ORDER — DEXAMETHASONE SODIUM PHOSPHATE 10 MG/ML IJ SOLN
INTRAMUSCULAR | Status: DC | PRN
  Administered 2020-11-25: 10 mg via INTRAVENOUS

## 2020-11-25 MED ORDER — DEXAMETHASONE SODIUM PHOSPHATE 10 MG/ML IJ SOLN
INTRAMUSCULAR | Status: AC
  Filled 2020-11-25: qty 1

## 2020-11-25 MED ORDER — BRILLIANT BLUE G 0.025 % IO SOSY
PREFILLED_SYRINGE | INTRAOCULAR | Status: DC | PRN
  Administered 2020-11-25: 0.5 mL via INTRAVITREAL

## 2020-11-25 MED ORDER — PHENYLEPHRINE 40 MCG/ML (10ML) SYRINGE FOR IV PUSH (FOR BLOOD PRESSURE SUPPORT)
PREFILLED_SYRINGE | INTRAVENOUS | Status: AC
  Filled 2020-11-25: qty 10

## 2020-11-25 MED ORDER — LACTATED RINGERS IV SOLN: INTRAVENOUS | Status: DC | PRN

## 2020-11-25 MED ORDER — CEFAZOLIN SUBCONJUNCTIVAL INJECTION 100 MG/0.5 ML
100.0000 mg | INJECTION | SUBCONJUNCTIVAL | Status: DC
  Filled 2020-11-25: qty 5

## 2020-11-25 MED ORDER — CHLORHEXIDINE GLUCONATE 0.12 % MT SOLN
15.0000 mL | Freq: Once | OROMUCOSAL | Status: AC
  Administered 2020-11-25: 15 mL via OROMUCOSAL
  Filled 2020-11-25: qty 15

## 2020-11-25 MED ORDER — PHENYLEPHRINE 40 MCG/ML (10ML) SYRINGE FOR IV PUSH (FOR BLOOD PRESSURE SUPPORT)
PREFILLED_SYRINGE | INTRAVENOUS | Status: DC | PRN
  Administered 2020-11-25 (×2): 40 ug via INTRAVENOUS

## 2020-11-25 MED ORDER — OXYCODONE HCL 5 MG/5ML PO SOLN: 5.0000 mg | Freq: Once | ORAL | Status: DC | PRN

## 2020-11-25 MED ORDER — SUGAMMADEX SODIUM 200 MG/2ML IV SOLN
INTRAVENOUS | Status: DC | PRN
  Administered 2020-11-25 (×2): 100 mg via INTRAVENOUS

## 2020-11-25 MED ORDER — EPINEPHRINE PF 1 MG/ML IJ SOLN
INTRAMUSCULAR | Status: AC
  Filled 2020-11-25: qty 1

## 2020-11-25 MED ORDER — FENTANYL CITRATE (PF) 250 MCG/5ML IJ SOLN
INTRAMUSCULAR | Status: AC
  Filled 2020-11-25: qty 5

## 2020-11-25 MED ORDER — ACETAMINOPHEN 10 MG/ML IV SOLN: 1000.0000 mg | Freq: Once | INTRAVENOUS | Status: DC | PRN

## 2020-11-25 SURGICAL SUPPLY — 58 items
APPLICATOR COTTON TIP 6 STRL (MISCELLANEOUS) ×1 IMPLANT
APPLICATOR COTTON TIP 6IN STRL (MISCELLANEOUS) ×2
BAND WRIST GAS GREEN (MISCELLANEOUS) IMPLANT
BLADE MVR KNIFE 20G (BLADE) IMPLANT
BLADE STAB KNIFE 15DEG (BLADE) IMPLANT
CANNULA ANT CHAM MAIN (OPHTHALMIC RELATED) IMPLANT
CANNULA DUAL BORE 23G (CANNULA) IMPLANT
CANNULA DUALBORE 25G (CANNULA) IMPLANT
CANNULA VLV SOFT TIP 25GA (OPHTHALMIC) ×4 IMPLANT
CAUTERY EYE LOW TEMP 1300F FIN (OPHTHALMIC RELATED) IMPLANT
CLSR STERI-STRIP ANTIMIC 1/2X4 (GAUZE/BANDAGES/DRESSINGS) ×2 IMPLANT
COVER MAYO STAND STRL (DRAPES) IMPLANT
DRAPE HALF SHEET 40X57 (DRAPES) ×2 IMPLANT
DRAPE INCISE 51X51 W/FILM STRL (DRAPES) ×2 IMPLANT
DRAPE RETRACTOR (MISCELLANEOUS) IMPLANT
FORCEPS ECKARDT ILM 25G SERR (OPHTHALMIC RELATED) IMPLANT
FORCEPS GRIESHABER ILM 25G A (INSTRUMENTS) ×2 IMPLANT
GAS AUTO FILL CONSTEL (OPHTHALMIC) ×4
GAS AUTO FILL CONSTELLATION (OPHTHALMIC) ×2 IMPLANT
GAS WRIST BAND GREEN (MISCELLANEOUS)
GLOVE SURG SYN 7.5  E (GLOVE) ×1
GLOVE SURG SYN 7.5 E (GLOVE) ×1 IMPLANT
GOWN STRL REUS W/ TWL LRG LVL3 (GOWN DISPOSABLE) ×1 IMPLANT
GOWN STRL REUS W/TWL LRG LVL3 (GOWN DISPOSABLE) ×1
KIT BASIN OR (CUSTOM PROCEDURE TRAY) ×2 IMPLANT
KIT TURNOVER KIT B (KITS) ×2 IMPLANT
LENS BIOM SUPER VIEW SET DISP (MISCELLANEOUS) ×2 IMPLANT
MICROPICK 25G (MISCELLANEOUS)
NEEDLE 18GX1X1/2 (RX/OR ONLY) (NEEDLE) ×4 IMPLANT
NEEDLE 25GX 5/8IN NON SAFETY (NEEDLE) ×2 IMPLANT
NEEDLE 27GX1/2 REG BEVEL ECLIP (NEEDLE) ×2 IMPLANT
NEEDLE FILTER BLUNT 18X 1/2SAF (NEEDLE) ×1
NEEDLE FILTER BLUNT 18X1 1/2 (NEEDLE) ×1 IMPLANT
NEEDLE HYPO 25GX1X1/2 BEV (NEEDLE) IMPLANT
NEEDLE HYPO 30X.5 LL (NEEDLE) ×4 IMPLANT
NEEDLE RETROBULBAR 25GX1.5 (NEEDLE) ×2 IMPLANT
NS IRRIG 1000ML POUR BTL (IV SOLUTION) ×2 IMPLANT
PACK FRAGMATOME (OPHTHALMIC) IMPLANT
PACK VITRECTOMY CUSTOM (CUSTOM PROCEDURE TRAY) ×2 IMPLANT
PAD ARMBOARD 7.5X6 YLW CONV (MISCELLANEOUS) ×4 IMPLANT
PAK PIK VITRECTOMY CVS 25GA (OPHTHALMIC) ×2 IMPLANT
PIC ILLUMINATED 25G (OPHTHALMIC) ×2
PICK MICROPICK 25G (MISCELLANEOUS) IMPLANT
PIK ILLUMINATED 25G (OPHTHALMIC) ×1 IMPLANT
PROBE ENDO DIATHERMY 25G (MISCELLANEOUS) ×2 IMPLANT
PROBE LASER ILLUM FLEX CVD 25G (OPHTHALMIC) ×2 IMPLANT
ROLLS DENTAL (MISCELLANEOUS) IMPLANT
SCRAPER DIAMOND 25GA (OPHTHALMIC RELATED) ×2 IMPLANT
SOL ANTI FOG 6CC (MISCELLANEOUS) ×1 IMPLANT
SOLUTION ANTI FOG 6CC (MISCELLANEOUS) ×1
STOPCOCK 4 WAY LG BORE MALE ST (IV SETS) IMPLANT
SUT VICRYL 7 0 TG140 8 (SUTURE) ×2 IMPLANT
SUT VICRYL 8 0 TG140 8 (SUTURE) IMPLANT
SYR 10ML LL (SYRINGE) IMPLANT
SYR 20ML LL LF (SYRINGE) ×2 IMPLANT
SYR 5ML LL (SYRINGE) ×2 IMPLANT
SYR TB 1ML LUER SLIP (SYRINGE) ×2 IMPLANT
WATER STERILE IRR 1000ML POUR (IV SOLUTION) ×2 IMPLANT

## 2020-11-25 NOTE — Brief Op Note (Signed)
11/25/2020  5:55 PM  PATIENT:  Shelly Hill  30 y.o. 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  PROCEDURE:  Procedure(s): PARS PLANA VITRECTOMY WITH 25 GAUGE (Right) PHOTOCOAGULATION WITH LASER (Right) MEMBRANE PEEL (Right) SILICON OIL REMOVAL (Right) INSERTION OF GAS C3F8 (Right)  SURGEON:  Surgeon(s) and Role:    * Carmela Rima, MD - Primary  PHYSICIAN ASSISTANT:   ASSISTANTS: none   ANESTHESIA:   local and general  EBL:  minimal   BLOOD ADMINISTERED:none  DRAINS: none   LOCAL MEDICATIONS USED:  MARCAINE    and LIDOCAINE   SPECIMEN:  No Specimen  DISPOSITION OF SPECIMEN:  N/A  COUNTS:  YES  TOURNIQUET:  * No tourniquets in log *  DICTATION: .Note written in EPIC  PLAN OF CARE: Discharge to home after PACU  PATIENT DISPOSITION:  PACU - hemodynamically stable.   Delay start of Pharmacological VTE agent (>24hrs) due to surgical blood loss or risk of bleeding: not applicable

## 2020-11-25 NOTE — Anesthesia Preprocedure Evaluation (Signed)
Anesthesia Evaluation  Patient identified by MRN, date of birth, ID band Patient awake    Reviewed: Allergy & Precautions, NPO status , Patient's Chart, lab work & pertinent test results  Airway Mallampati: II  TM Distance: >3 FB Neck ROM: Full    Dental no notable dental hx.    Pulmonary neg pulmonary ROS, former smoker,    Pulmonary exam normal breath sounds clear to auscultation       Cardiovascular negative cardio ROS Normal cardiovascular exam Rhythm:Regular Rate:Normal     Neuro/Psych Anxiety Depression negative neurological ROS     GI/Hepatic negative GI ROS, Neg liver ROS,   Endo/Other  negative endocrine ROS  Renal/GU negative Renal ROS  negative genitourinary   Musculoskeletal negative musculoskeletal ROS (+)   Abdominal   Peds negative pediatric ROS (+)  Hematology negative hematology ROS (+)   Anesthesia Other Findings   Reproductive/Obstetrics negative OB ROS                             Anesthesia Physical Anesthesia Plan  ASA: II  Anesthesia Plan: General   Post-op Pain Management:    Induction: Intravenous  PONV Risk Score and Plan: 3 and Ondansetron, Dexamethasone, Midazolam and Treatment may vary due to age or medical condition  Airway Management Planned: Oral ETT  Additional Equipment:   Intra-op Plan:   Post-operative Plan: Extubation in OR  Informed Consent: I have reviewed the patients History and Physical, chart, labs and discussed the procedure including the risks, benefits and alternatives for the proposed anesthesia with the patient or authorized representative who has indicated his/her understanding and acceptance.     Dental advisory given  Plan Discussed with: CRNA and Surgeon  Anesthesia Plan Comments:         Anesthesia Quick Evaluation

## 2020-11-25 NOTE — H&P (Signed)
Date of examination:  11/25/20  Indication for surgery: retained silicone oil and macular pucker right eye  Pertinent past medical history:  Past Medical History:  Diagnosis Date  . ADHD   . Anemia   . Anxiety   . Depression   . 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 Depression Mother   . GER disease Mother   . Depression Father   . Insomnia Father   . Heart disease Maternal Grandmother   . Heart disease Maternal Grandfather   . Cancer Maternal Grandfather        lung cancer  . Diabetes Paternal Grandmother     General:  Healthy appearing patient in no distress.     Eyes:    Acuity OD 20/200     External: Within normal limits      Anterior segment: cataract OD    Fundus: pucker and inferonasal gliosis       Impression: Retained silicone oil and macular pucker right eye  Plan:   Removal of silicone oil and macular pucker repair right eye  Carmela Rima, MD

## 2020-11-25 NOTE — Discharge Instructions (Addendum)
DO NOT SLEEP ON BACK, THE EYE PRESSURE CAN GO UP AND CAUSE VISION LOSS   SLEEP ON SIDE WITH NOSE TO PILLOW  DURING DAY KEEP FACE DOWN.  15 MINUTES EVERY 2 HOURS IT IS OK TO LOOK STRAIGHT AHEAD (USE BATHROOM, EAT, WALK, ETC.)  

## 2020-11-25 NOTE — Transfer of Care (Signed)
Immediate Anesthesia Transfer of Care Note  Patient: Shelly Hill  Procedure(s) Performed: PARS PLANA VITRECTOMY WITH 25 GAUGE (Right Eye) PHOTOCOAGULATION WITH LASER (Right Eye) MEMBRANE PEEL (Right Eye) SILICON OIL REMOVAL (Right Eye) INSERTION OF GAS C3F8 (Right Eye)  Patient Location: PACU  Anesthesia Type:General  Level of Consciousness: drowsy and patient cooperative  Airway & Oxygen Therapy: Patient Spontanous Breathing and Patient connected to nasal cannula oxygen  Post-op Assessment: Report given to RN and Post -op Vital signs reviewed and stable  Post vital signs: Reviewed  Last Vitals:  Vitals Value Taken Time  BP 130/70 11/25/20 1815  Temp    Pulse 91 11/25/20 1817  Resp 16 11/25/20 1817  SpO2 97 % 11/25/20 1817  Vitals shown include unvalidated device data.  Last Pain:  Vitals:   11/25/20 1324  TempSrc: Oral  PainSc:          Complications: No complications documented.

## 2020-11-25 NOTE — Anesthesia Procedure Notes (Signed)
Procedure Name: Intubation Date/Time: 11/25/2020 4:12 PM Performed by: Rosiland Oz, CRNA Pre-anesthesia Checklist: Patient identified, Emergency Drugs available, Suction available, Patient being monitored and Timeout performed Patient Re-evaluated:Patient Re-evaluated prior to induction Oxygen Delivery Method: Circle system utilized Preoxygenation: Pre-oxygenation with 100% oxygen Induction Type: IV induction Ventilation: Mask ventilation without difficulty Laryngoscope Size: Miller and 3 Grade View: Grade I Tube type: Oral Tube size: 7.0 mm Number of attempts: 1 Airway Equipment and Method: Stylet Placement Confirmation: ETT inserted through vocal cords under direct vision,  positive ETCO2 and breath sounds checked- equal and bilateral Secured at: 21 cm Tube secured with: Tape Dental Injury: Teeth and Oropharynx as per pre-operative assessment

## 2020-11-26 ENCOUNTER — Encounter (HOSPITAL_COMMUNITY): Payer: Self-pay | Admitting: Ophthalmology

## 2020-11-26 NOTE — Anesthesia Postprocedure Evaluation (Signed)
Anesthesia Post Note  Patient: Vanassa Penniman  Procedure(s) Performed: PARS PLANA VITRECTOMY WITH 25 GAUGE (Right Eye) PHOTOCOAGULATION WITH LASER (Right Eye) MEMBRANE PEEL (Right Eye) SILICON OIL REMOVAL (Right Eye) INSERTION OF GAS C3F8 (Right Eye)     Patient location during evaluation: PACU Anesthesia Type: General Level of consciousness: awake and alert Pain management: pain level controlled Vital Signs Assessment: post-procedure vital signs reviewed and stable Respiratory status: spontaneous breathing, nonlabored ventilation, respiratory function stable and patient connected to nasal cannula oxygen Cardiovascular status: blood pressure returned to baseline and stable Postop Assessment: no apparent nausea or vomiting Anesthetic complications: no   No complications documented.  Last Vitals:  Vitals:   11/25/20 1830 11/25/20 1841  BP: (!) 111/57 107/62  Pulse: 81 81  Resp: 18 16  Temp:  36.4 C  SpO2: 95% 95%    Last Pain:  Vitals:   11/25/20 1841  TempSrc:   PainSc: 0-No pain                 Mahad Newstrom S

## 2020-12-18 NOTE — Op Note (Signed)
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  A lid speculum was placed.  Infusion line and trocar was placed at the 8 o'clock position approximately 3.5 mm from the surgical limbus.   The infusion line was allowed to run and then clamped when placed at the cannula opening. The line was inserted and secured to the drape with an adhesive strip.   Active trocars/cannula were placed at the 10 and 2 o'clock positions approximately 3.5 mm from the surgical limbus. The cannula was visualized in the vitreous cavity.  The silicone oil extraction kit was used to remove the silicone oil from the posterior chamber.  Membrane blue was placed over the nasal macula and irrigated free.  The Tano scratcher was used to elevate the epiretinal membrane and associated internal limiting membrane (ILM) complex. The ILM forceps were used to remove the epiretinal membrane and internal limiting membrane.  As the membrane was removed the nasal retina was noted to detach.    A complete air-fluid exchange was performed and the retina flattened.  Endolaser was applied surrounding the area of detachment and the nasal break.  12% C3F8 gas was placed in the eye.  The superior cannulas were sequentially removed and the sclerotomies closed with 8-0 Vicryl.  The infusion line and trocar were removed and the sclerotomy was similarly closed.  Additional gas was placed in the eye and normal intraocular pressure by digital palpapation was noted.  Subconjunctival injections of   Dexamethasone 53m/1ml was placed in the infero-medial quadrant.   The speculum and drapes were removed and the eye was patched with Polymixin/Bacitracin ophthalmic ointment. An eye shield was placed and the patient was transferred alert and conversant with stable vital signs to the post operative recovery area.  The patient tolerated the procedure well and no complications were noted.  NRoyston CowperMD

## 2021-01-05 ENCOUNTER — Ambulatory Visit: Payer: Self-pay | Admitting: Physician Assistant

## 2021-01-09 ENCOUNTER — Other Ambulatory Visit: Payer: Self-pay

## 2021-01-09 ENCOUNTER — Encounter (HOSPITAL_COMMUNITY): Payer: Self-pay | Admitting: Ophthalmology

## 2021-01-09 NOTE — Progress Notes (Signed)
Spoke with pt for pre-op call. Pt does not have a cardiac history and is not diabetic.   Pt's surgery is scheduled as ambulatory so no Covid test is required prior to surgery. Pt denies any recent symptoms of Covid.

## 2021-01-09 NOTE — Anesthesia Preprocedure Evaluation (Addendum)
Anesthesia Evaluation  Patient identified by MRN, date of birth, ID band Patient awake    Reviewed: Allergy & Precautions, NPO status , Patient's Chart, lab work & pertinent test results  History of Anesthesia Complications Negative for: history of anesthetic complications  Airway Mallampati: II  TM Distance: >3 FB     Dental  (+) Dental Advisory Given, Teeth Intact   Pulmonary former smoker,    breath sounds clear to auscultation       Cardiovascular negative cardio ROS   Rhythm:Regular Rate:Normal     Neuro/Psych PSYCHIATRIC DISORDERS (ADHD) Anxiety Depression Retinal detachment    GI/Hepatic negative GI ROS, Neg liver ROS,   Endo/Other  negative endocrine ROS  Renal/GU negative Renal ROS     Musculoskeletal   Abdominal   Peds  Hematology negative hematology ROS (+)   Anesthesia Other Findings   Reproductive/Obstetrics                            Anesthesia Physical Anesthesia Plan  ASA: 2  Anesthesia Plan: General   Post-op Pain Management:    Induction: Intravenous  PONV Risk Score and Plan: 3 and Ondansetron, Dexamethasone and Treatment may vary due to age or medical condition  Airway Management Planned: Oral ETT  Additional Equipment: None  Intra-op Plan:   Post-operative Plan: Extubation in OR  Informed Consent: I have reviewed the patients History and Physical, chart, labs and discussed the procedure including the risks, benefits and alternatives for the proposed anesthesia with the patient or authorized representative who has indicated his/her understanding and acceptance.     Dental advisory given  Plan Discussed with: CRNA and Surgeon  Anesthesia Plan Comments:        Anesthesia Quick Evaluation

## 2021-01-10 ENCOUNTER — Ambulatory Visit: Payer: Self-pay | Admitting: Ophthalmology

## 2021-01-11 ENCOUNTER — Ambulatory Visit (HOSPITAL_COMMUNITY)
Admission: RE | Admit: 2021-01-11 | Discharge: 2021-01-11 | Disposition: A | Discharge status: Home / Self Care | Payer: 59 | Attending: Ophthalmology | Admitting: Ophthalmology

## 2021-01-11 ENCOUNTER — Ambulatory Visit (HOSPITAL_COMMUNITY): Payer: 59 | Admitting: Anesthesiology

## 2021-01-11 ENCOUNTER — Encounter (HOSPITAL_COMMUNITY): Payer: Self-pay | Admitting: Ophthalmology

## 2021-01-11 ENCOUNTER — Encounter (HOSPITAL_COMMUNITY)
Admission: RE | Disposition: A | Discharge status: Home / Self Care | Payer: Self-pay | Source: Home / Self Care | Attending: Ophthalmology

## 2021-01-11 ENCOUNTER — Other Ambulatory Visit: Payer: Self-pay

## 2021-01-11 DIAGNOSIS — Z87891 Personal history of nicotine dependence: Secondary | ICD-10-CM | POA: Insufficient documentation

## 2021-01-11 DIAGNOSIS — H3341 Traction detachment of retina, right eye: Secondary | ICD-10-CM | POA: Insufficient documentation

## 2021-01-11 HISTORY — PX: INJECTION OF SILICONE OIL: SHX6422

## 2021-01-11 HISTORY — PX: REPAIR OF COMPLEX TRACTION RETINAL DETACHMENT: SHX6217

## 2021-01-11 HISTORY — PX: PARS PLANA VITRECTOMY 27 GAUGE: SHX6738

## 2021-01-11 LAB — CBC
HCT: 40.7 % (ref 36.0–46.0)
Hemoglobin: 14.2 g/dL (ref 12.0–15.0)
MCH: 31.8 pg (ref 26.0–34.0)
MCHC: 34.9 g/dL (ref 30.0–36.0)
MCV: 91.1 fL (ref 80.0–100.0)
Platelets: 212 10*3/uL (ref 150–400)
RBC: 4.47 MIL/uL (ref 3.87–5.11)
RDW: 11.8 % (ref 11.5–15.5)
WBC: 7.2 10*3/uL (ref 4.0–10.5)
nRBC: 0 % (ref 0.0–0.2)

## 2021-01-11 LAB — POCT PREGNANCY, URINE: Preg Test, Ur: NEGATIVE

## 2021-01-11 SURGERY — REPAIR, RETINAL DETACHMENT, COMPLEX
Anesthesia: General | Laterality: Right

## 2021-01-11 MED ORDER — CHLORHEXIDINE GLUCONATE 0.12 % MT SOLN
15.0000 mL | Freq: Once | OROMUCOSAL | Status: AC
  Administered 2021-01-11: 15 mL via OROMUCOSAL
  Filled 2021-01-11: qty 15

## 2021-01-11 MED ORDER — TOBRAMYCIN-DEXAMETHASONE 0.3-0.1 % OP OINT
TOPICAL_OINTMENT | OPHTHALMIC | Status: DC | PRN
  Administered 2021-01-11: 1 via OPHTHALMIC

## 2021-01-11 MED ORDER — SODIUM HYALURONATE 10 MG/ML IO SOLUTION
PREFILLED_SYRINGE | INTRAOCULAR | Status: AC
  Filled 2021-01-11: qty 0.85

## 2021-01-11 MED ORDER — PHENYLEPHRINE 40 MCG/ML (10ML) SYRINGE FOR IV PUSH (FOR BLOOD PRESSURE SUPPORT)
PREFILLED_SYRINGE | INTRAVENOUS | Status: AC
  Filled 2021-01-11: qty 10

## 2021-01-11 MED ORDER — ROCURONIUM BROMIDE 10 MG/ML (PF) SYRINGE
PREFILLED_SYRINGE | INTRAVENOUS | Status: AC
  Filled 2021-01-11: qty 10

## 2021-01-11 MED ORDER — MIDAZOLAM HCL 2 MG/2ML IJ SOLN: 0.5000 mg | Freq: Once | INTRAMUSCULAR | Status: DC | PRN

## 2021-01-11 MED ORDER — ATROPINE SULFATE 1 % OP SOLN
OPHTHALMIC | Status: AC
  Filled 2021-01-11: qty 5

## 2021-01-11 MED ORDER — CYCLOPENTOLATE HCL 1 % OP SOLN
1.0000 [drp] | OPHTHALMIC | Status: AC | PRN
  Administered 2021-01-11 (×3): 1 [drp] via OPHTHALMIC
  Filled 2021-01-11: qty 2

## 2021-01-11 MED ORDER — ACETAMINOPHEN 500 MG PO TABS
ORAL_TABLET | ORAL | Status: AC
  Administered 2021-01-11: 1000 mg via ORAL
  Filled 2021-01-11: qty 2

## 2021-01-11 MED ORDER — LIDOCAINE HCL 2 % IJ SOLN
INTRAMUSCULAR | Status: DC | PRN
  Administered 2021-01-11: 2 mL via RETROBULBAR

## 2021-01-11 MED ORDER — LIDOCAINE 2% (20 MG/ML) 5 ML SYRINGE
INTRAMUSCULAR | Status: AC
  Filled 2021-01-11: qty 5

## 2021-01-11 MED ORDER — OFLOXACIN 0.3 % OP SOLN
1.0000 [drp] | OPHTHALMIC | Status: AC | PRN
  Administered 2021-01-11 (×3): 1 [drp] via OPHTHALMIC
  Filled 2021-01-11: qty 5

## 2021-01-11 MED ORDER — PROPARACAINE HCL 0.5 % OP SOLN
1.0000 [drp] | OPHTHALMIC | Status: AC | PRN
  Administered 2021-01-11 (×3): 1 [drp] via OPHTHALMIC
  Filled 2021-01-11: qty 15

## 2021-01-11 MED ORDER — DEXAMETHASONE SODIUM PHOSPHATE 10 MG/ML IJ SOLN
INTRAMUSCULAR | Status: AC
  Filled 2021-01-11: qty 1

## 2021-01-11 MED ORDER — DEXMEDETOMIDINE (PRECEDEX) IN NS 20 MCG/5ML (4 MCG/ML) IV SYRINGE
PREFILLED_SYRINGE | INTRAVENOUS | Status: DC | PRN
  Administered 2021-01-11: 12 ug via INTRAVENOUS

## 2021-01-11 MED ORDER — EPINEPHRINE PF 1 MG/ML IJ SOLN
INTRAMUSCULAR | Status: AC
  Filled 2021-01-11: qty 1

## 2021-01-11 MED ORDER — PHENYLEPHRINE 40 MCG/ML (10ML) SYRINGE FOR IV PUSH (FOR BLOOD PRESSURE SUPPORT)
PREFILLED_SYRINGE | INTRAVENOUS | Status: DC | PRN
  Administered 2021-01-11 (×3): 40 ug via INTRAVENOUS
  Administered 2021-01-11: 80 ug via INTRAVENOUS

## 2021-01-11 MED ORDER — PHENYLEPHRINE HCL 2.5 % OP SOLN
1.0000 [drp] | OPHTHALMIC | Status: AC | PRN
  Administered 2021-01-11 (×3): 1 [drp] via OPHTHALMIC
  Filled 2021-01-11: qty 2

## 2021-01-11 MED ORDER — CEFAZOLIN SUBCONJUNCTIVAL INJECTION 100 MG/0.5 ML
INJECTION | SUBCONJUNCTIVAL | Status: DC | PRN
  Administered 2021-01-11: 100 mg via SUBCONJUNCTIVAL

## 2021-01-11 MED ORDER — PROPOFOL 10 MG/ML IV BOLUS
INTRAVENOUS | Status: AC
  Filled 2021-01-11: qty 20

## 2021-01-11 MED ORDER — HYDROMORPHONE HCL 1 MG/ML IJ SOLN: 0.2500 mg | INTRAMUSCULAR | Status: DC | PRN

## 2021-01-11 MED ORDER — BSS IO SOLN
INTRAOCULAR | Status: AC
  Filled 2021-01-11: qty 15

## 2021-01-11 MED ORDER — LACTATED RINGERS IV SOLN: INTRAVENOUS | Status: DC | PRN

## 2021-01-11 MED ORDER — LIDOCAINE 2% (20 MG/ML) 5 ML SYRINGE
INTRAMUSCULAR | Status: DC | PRN
  Administered 2021-01-11: 20 mg via INTRAVENOUS

## 2021-01-11 MED ORDER — PROMETHAZINE HCL 25 MG/ML IJ SOLN: 6.2500 mg | INTRAMUSCULAR | Status: DC | PRN

## 2021-01-11 MED ORDER — NA CHONDROIT SULF-NA HYALURON 40-30 MG/ML IO SOSY
INTRAOCULAR | Status: AC
  Filled 2021-01-11: qty 0.5

## 2021-01-11 MED ORDER — ORAL CARE MOUTH RINSE: 15.0000 mL | Freq: Once | OROMUCOSAL | Status: AC

## 2021-01-11 MED ORDER — INDOCYANINE GREEN 25 MG IV SOLR
INTRAVENOUS | Status: AC
  Filled 2021-01-11: qty 10

## 2021-01-11 MED ORDER — OXYCODONE HCL 5 MG PO TABS
5.0000 mg | ORAL_TABLET | Freq: Once | ORAL | Status: AC | PRN
Start: 2021-01-11 — End: 2021-01-11
  Administered 2021-01-11: 5 mg via ORAL

## 2021-01-11 MED ORDER — MIDAZOLAM HCL 2 MG/2ML IJ SOLN
INTRAMUSCULAR | Status: AC
  Filled 2021-01-11: qty 2

## 2021-01-11 MED ORDER — CEFAZOLIN SUBCONJUNCTIVAL INJECTION 100 MG/0.5 ML
100.0000 mg | INJECTION | SUBCONJUNCTIVAL | Status: DC
  Filled 2021-01-11: qty 5

## 2021-01-11 MED ORDER — ACETAMINOPHEN 500 MG PO TABS: 1000.0000 mg | ORAL_TABLET | Freq: Once | ORAL | Status: AC

## 2021-01-11 MED ORDER — HYALURONIDASE HUMAN 150 UNIT/ML IJ SOLN
INTRAMUSCULAR | Status: AC
  Filled 2021-01-11: qty 1

## 2021-01-11 MED ORDER — TOBRAMYCIN-DEXAMETHASONE 0.3-0.1 % OP OINT
TOPICAL_OINTMENT | OPHTHALMIC | Status: AC
  Filled 2021-01-11: qty 3.5

## 2021-01-11 MED ORDER — FENTANYL CITRATE (PF) 250 MCG/5ML IJ SOLN
INTRAMUSCULAR | Status: AC
  Filled 2021-01-11: qty 5

## 2021-01-11 MED ORDER — OXYCODONE HCL 5 MG/5ML PO SOLN: 5.0000 mg | Freq: Once | ORAL | Status: AC | PRN

## 2021-01-11 MED ORDER — OXYCODONE HCL 5 MG PO TABS
ORAL_TABLET | ORAL | Status: AC
  Filled 2021-01-11: qty 1

## 2021-01-11 MED ORDER — LIDOCAINE HCL 2 % IJ SOLN
INTRAMUSCULAR | Status: AC
  Filled 2021-01-11: qty 20

## 2021-01-11 MED ORDER — BSS PLUS IO SOLN
INTRAOCULAR | Status: AC
  Filled 2021-01-11: qty 500

## 2021-01-11 MED ORDER — DEXAMETHASONE SODIUM PHOSPHATE 10 MG/ML IJ SOLN
INTRAMUSCULAR | Status: DC | PRN
  Administered 2021-01-11: 10 mg

## 2021-01-11 MED ORDER — PHENYLEPHRINE HCL-NACL 10-0.9 MG/250ML-% IV SOLN
INTRAVENOUS | Status: DC | PRN
  Administered 2021-01-11: 35 ug/min via INTRAVENOUS

## 2021-01-11 MED ORDER — MEPERIDINE HCL 25 MG/ML IJ SOLN: 6.2500 mg | INTRAMUSCULAR | Status: DC | PRN

## 2021-01-11 MED ORDER — PROPOFOL 10 MG/ML IV BOLUS
INTRAVENOUS | Status: DC | PRN
  Administered 2021-01-11: 30 mg via INTRAVENOUS
  Administered 2021-01-11: 100 mg via INTRAVENOUS
  Administered 2021-01-11: 50 mg via INTRAVENOUS

## 2021-01-11 MED ORDER — BSS IO SOLN
INTRAOCULAR | Status: DC | PRN
  Administered 2021-01-11: 15 mL via INTRAOCULAR

## 2021-01-11 MED ORDER — SUGAMMADEX SODIUM 200 MG/2ML IV SOLN
INTRAVENOUS | Status: DC | PRN
  Administered 2021-01-11: 200 mg via INTRAVENOUS

## 2021-01-11 MED ORDER — FENTANYL CITRATE (PF) 100 MCG/2ML IJ SOLN
INTRAMUSCULAR | Status: DC | PRN
  Administered 2021-01-11: 50 ug via INTRAVENOUS

## 2021-01-11 MED ORDER — TETRACAINE HCL 0.5 % OP SOLN
OPHTHALMIC | Status: AC
  Filled 2021-01-11: qty 4

## 2021-01-11 MED ORDER — EPHEDRINE SULFATE-NACL 50-0.9 MG/10ML-% IV SOSY
PREFILLED_SYRINGE | INTRAVENOUS | Status: DC | PRN
  Administered 2021-01-11: 5 mg via INTRAVENOUS

## 2021-01-11 MED ORDER — MIDAZOLAM HCL 5 MG/5ML IJ SOLN
INTRAMUSCULAR | Status: DC | PRN
  Administered 2021-01-11: 2 mg via INTRAVENOUS

## 2021-01-11 MED ORDER — EPINEPHRINE PF 1 MG/ML IJ SOLN: INTRAOCULAR | Status: DC | PRN

## 2021-01-11 MED ORDER — SODIUM CHLORIDE 0.9 % IV SOLN: INTRAVENOUS | Status: DC

## 2021-01-11 MED ORDER — BUPIVACAINE HCL (PF) 0.75 % IJ SOLN
INTRAMUSCULAR | Status: AC
  Filled 2021-01-11: qty 10

## 2021-01-11 MED ORDER — ROCURONIUM BROMIDE 10 MG/ML (PF) SYRINGE
PREFILLED_SYRINGE | INTRAVENOUS | Status: DC | PRN
  Administered 2021-01-11 (×2): 10 mg via INTRAVENOUS
  Administered 2021-01-11: 40 mg via INTRAVENOUS
  Administered 2021-01-11: 20 mg via INTRAVENOUS

## 2021-01-11 SURGICAL SUPPLY — 69 items
APPLICATOR COTTON TIP 6 STRL (MISCELLANEOUS) ×2 IMPLANT
APPLICATOR COTTON TIP 6IN STRL (MISCELLANEOUS) ×4 IMPLANT
BAG COUNTER SPONGE SURGICOUNT (BAG) ×2 IMPLANT
BAND WRIST GAS GREEN (MISCELLANEOUS) IMPLANT
BLADE MVR KNIFE 20G (BLADE) IMPLANT
BLADE STAB KNIFE 15DEG (BLADE) IMPLANT
BNDG EYE OVAL (GAUZE/BANDAGES/DRESSINGS) ×2 IMPLANT
CANNULA ANT CHAM MAIN (OPHTHALMIC RELATED) IMPLANT
CANNULA DUAL BORE 23G (CANNULA) IMPLANT
CANNULA DUALBORE 25G (CANNULA) IMPLANT
CANNULA VLV SOFT TIP 25GA (OPHTHALMIC) ×2 IMPLANT
CANNULA VLV SOFT TIP 27GA (OPHTHALMIC) ×2 IMPLANT
CAUTERY EYE LOW TEMP 1300F FIN (OPHTHALMIC RELATED) IMPLANT
CLSR STERI-STRIP ANTIMIC 1/2X4 (GAUZE/BANDAGES/DRESSINGS) ×2 IMPLANT
COVER MAYO STAND STRL (DRAPES) IMPLANT
DRAPE HALF SHEET 40X57 (DRAPES) ×2 IMPLANT
DRAPE INCISE 51X51 W/FILM STRL (DRAPES) IMPLANT
DRAPE RETRACTOR (MISCELLANEOUS) ×2 IMPLANT
ERASER HMR WETFIELD 23G BP (MISCELLANEOUS) IMPLANT
FORCEPS ECKARDT ILM 25G SERR (OPHTHALMIC RELATED) IMPLANT
FORCEPS GRIESHABER ILM 25G A (INSTRUMENTS) IMPLANT
FORCEPS GRIESHABER ILM 27G (INSTRUMENTS) ×2 IMPLANT
GAS AUTO FILL CONSTEL (OPHTHALMIC)
GAS AUTO FILL CONSTELLATION (OPHTHALMIC) IMPLANT
GAS WRIST BAND GREEN (MISCELLANEOUS)
GLOVE SURG ENC TEXT LTX SZ7 (GLOVE) ×2 IMPLANT
GLOVE SURG SYN 7.5  E (GLOVE) ×1
GLOVE SURG SYN 7.5 E (GLOVE) ×1 IMPLANT
GOWN STRL REUS W/ TWL LRG LVL3 (GOWN DISPOSABLE) ×1 IMPLANT
GOWN STRL REUS W/TWL LRG LVL3 (GOWN DISPOSABLE) ×1
KIT BASIN OR (CUSTOM PROCEDURE TRAY) ×2 IMPLANT
KIT TURNOVER KIT B (KITS) ×2 IMPLANT
LENS BIOM SUPER VIEW SET DISP (MISCELLANEOUS) ×2 IMPLANT
MICROPICK 25G (MISCELLANEOUS)
NEEDLE 18GX1X1/2 (RX/OR ONLY) (NEEDLE) ×2 IMPLANT
NEEDLE 25GX 5/8IN NON SAFETY (NEEDLE) ×2 IMPLANT
NEEDLE 27GX1/2 REG BEVEL ECLIP (NEEDLE) ×2 IMPLANT
NEEDLE FILTER BLUNT 18X 1/2SAF (NEEDLE) ×3
NEEDLE FILTER BLUNT 18X1 1/2 (NEEDLE) ×3 IMPLANT
NEEDLE HYPO 25GX1X1/2 BEV (NEEDLE) IMPLANT
NEEDLE HYPO 30X.5 LL (NEEDLE) ×4 IMPLANT
NEEDLE RETROBULBAR 25GX1.5 (NEEDLE) ×2 IMPLANT
NS IRRIG 1000ML POUR BTL (IV SOLUTION) ×2 IMPLANT
OIL SILICONE OPHTHALMIC ADAPTO (Ophthalmic Related) ×2 IMPLANT
PACK FRAGMATOME (OPHTHALMIC) IMPLANT
PACK VITRECTOMY CUSTOM (CUSTOM PROCEDURE TRAY) ×2 IMPLANT
PAD ARMBOARD 7.5X6 YLW CONV (MISCELLANEOUS) ×4 IMPLANT
PAK PIK VITRECTOMY CVS 25GA (OPHTHALMIC) ×2 IMPLANT
PAK VITRECTOMY PIK  27GA (OPHTHALMIC) ×1
PAK VITRECTOMY PIK 27GA (OPHTHALMIC) ×1 IMPLANT
PICK MICROPICK 25G (MISCELLANEOUS) IMPLANT
PROBE DIATHERMY DSP 27GA (MISCELLANEOUS) IMPLANT
PROBE ENDO DIATHERMY 25G (MISCELLANEOUS) IMPLANT
PROBE LASER ILLUM FLEX 27GA (OPHTHALMIC) IMPLANT
PROBE LASER ILLUM FLEX CVD 25G (OPHTHALMIC) IMPLANT
ROLLS DENTAL (MISCELLANEOUS) IMPLANT
SCRAPER DIAMOND 25GA (OPHTHALMIC RELATED) IMPLANT
SET INJECTOR OIL FLUID CONSTEL (OPHTHALMIC) ×2 IMPLANT
SHIELD EYE LENSE ONLY DISP (GAUZE/BANDAGES/DRESSINGS) ×2 IMPLANT
SOL ANTI FOG 6CC (MISCELLANEOUS) ×1 IMPLANT
SOLUTION ANTI FOG 6CC (MISCELLANEOUS) ×1
STOPCOCK 4 WAY LG BORE MALE ST (IV SETS) IMPLANT
SUT VICRYL 7 0 TG140 8 (SUTURE) ×2 IMPLANT
SUT VICRYL 8 0 TG140 8 (SUTURE) IMPLANT
SYR 10ML LL (SYRINGE) ×6 IMPLANT
SYR 5ML LL (SYRINGE) ×4 IMPLANT
SYR TB 1ML LUER SLIP (SYRINGE) ×2 IMPLANT
WATER STERILE IRR 1000ML POUR (IV SOLUTION) ×2 IMPLANT
WIPE INSTRUMENT VISIWIPE 73X73 (MISCELLANEOUS) IMPLANT

## 2021-01-11 NOTE — Discharge Instructions (Addendum)
DO NOT SLEEP ON BACK, THE EYE PRESSURE CAN GO UP AND CAUSE VISION LOSS   SLEEP ON SIDE WITH NOSE TO PILLOW  DURING DAY KEEP UPRIGHT 

## 2021-01-11 NOTE — Anesthesia Postprocedure Evaluation (Signed)
Anesthesia Post Note  Patient: Shelly Hill  Procedure(s) Performed: REPAIR OF COMPLEX TRACTION RETINAL DETACHMENT (Right) PARS PLANA VITRECTOMY 27 GAUGE WITH SILICONE OIL (Right) INJECTION OF SILICONE OIL (Right)     Patient location during evaluation: Phase II Anesthesia Type: General Level of consciousness: awake and alert, patient cooperative and oriented Pain management: pain level controlled Vital Signs Assessment: post-procedure vital signs reviewed and stable Respiratory status: spontaneous breathing, nonlabored ventilation, respiratory function stable and patient connected to nasal cannula oxygen Cardiovascular status: blood pressure returned to baseline and stable Postop Assessment: no apparent nausea or vomiting, adequate PO intake and able to ambulate Anesthetic complications: no   No notable events documented.  Last Vitals:  Vitals:   01/11/21 1015 01/11/21 1030  BP: 107/69 116/78  Pulse: 82 86  Resp: 15 14  Temp:  (!) 36.4 C  SpO2: 99% 98%    Last Pain:  Vitals:   01/11/21 1030  TempSrc:   PainSc: 2                  Shelly Hill,Shelly Hill

## 2021-01-11 NOTE — Transfer of Care (Signed)
Immediate Anesthesia Transfer of Care Note  Patient: Shelly Hill  Procedure(s) Performed: REPAIR OF COMPLEX TRACTION RETINAL DETACHMENT (Right) PARS PLANA VITRECTOMY 27 GAUGE WITH SILICONE OIL (Right) INJECTION OF SILICONE OIL (Right)  Patient Location: PACU  Anesthesia Type:General  Level of Consciousness: awake and patient cooperative  Airway & Oxygen Therapy: Patient Spontanous Breathing  Post-op Assessment: Report given to RN and Post -op Vital signs reviewed and stable  Post vital signs: Reviewed and stable  Last Vitals:  Vitals Value Taken Time  BP 112/64 01/11/21 1000  Temp 36.3 C 01/11/21 1000  Pulse 78 01/11/21 1007  Resp 17 01/11/21 1007  SpO2 96 % 01/11/21 1007  Vitals shown include unvalidated device data.  Last Pain:  Vitals:   01/11/21 1000  TempSrc:   PainSc: 0-No pain      Patients Stated Pain Goal: 4 (01/11/21 7356)  Complications: No notable events documented.

## 2021-01-11 NOTE — H&P (Signed)
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     Eyes:    Acuity OD 20/HM    External: Within normal limits      Anterior segment: Capsular fibrosis   Fundus: OCT in office showed retinal detachment    Impression: Retinal detachment right eye  Plan:  Retinal detachment repair right eye  Carmela Rima, MD

## 2021-01-11 NOTE — Anesthesia Procedure Notes (Signed)
Procedure Name: Intubation Date/Time: 01/11/2021 7:55 AM Performed by: Lynnell Chad, CRNA Pre-anesthesia Checklist: Patient identified, Emergency Drugs available, Suction available and Patient being monitored Patient Re-evaluated:Patient Re-evaluated prior to induction Oxygen Delivery Method: Circle System Utilized Preoxygenation: Pre-oxygenation with 100% oxygen Induction Type: IV induction Ventilation: Mask ventilation without difficulty Laryngoscope Size: Miller and 2 Grade View: Grade I Tube type: Oral Tube size: 7.0 mm Number of attempts: 1 Airway Equipment and Method: Stylet and Oral airway Placement Confirmation: ETT inserted through vocal cords under direct vision, positive ETCO2 and breath sounds checked- equal and bilateral Secured at: 21 cm Tube secured with: Tape Dental Injury: Teeth and Oropharynx as per pre-operative assessment

## 2021-01-11 NOTE — Brief Op Note (Signed)
01/11/2021  9:41 AM  PATIENT:  Shelly Hill  30 y.o. 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  PROCEDURE:  Procedure(s): REPAIR OF COMPLEX TRACTION RETINAL DETACHMENT (Right) PARS PLANA VITRECTOMY 27 GAUGE WITH MEMBRANECTOMY, MEMBRANE PEEL, AIR/FLUID EXCHANGE, ENDOLASER AND SILICONE OIL TAMPONADE (Right) INJECTION OF SILICONE OIL (Right)  SURGEON:  Surgeon(s) and Role:    * Carmela Rima, MD - Primary  PHYSICIAN ASSISTANT:   ASSISTANTS: none   ANESTHESIA:   local and general  EBL:  MINIMAL   BLOOD ADMINISTERED:none  DRAINS: none   LOCAL MEDICATIONS USED:  MARCAINE    and LIDOCAINE   SPECIMEN:  No Specimen  DISPOSITION OF SPECIMEN:  N/A  COUNTS:  YES  TOURNIQUET:  * No tourniquets in log *  DICTATION: .Note written in EPIC  PLAN OF CARE: Discharge to home after PACU  PATIENT DISPOSITION:  PACU - hemodynamically stable.   Delay start of Pharmacological VTE agent (>24hrs) due to surgical blood loss or risk of bleeding: not applicable

## 2021-01-12 ENCOUNTER — Encounter (HOSPITAL_COMMUNITY): Payer: Self-pay | Admitting: Ophthalmology

## 2021-01-29 NOTE — Op Note (Signed)
Shelly Hill 01/11/2021  Diagnosis: Retinal detachment with proliferative vitreoretinopathy   Procedure: Pars Plana Vitrectomy, Membrane Peeling, Endolaser, Fluid Gas Exchange, Silicone Oil, and membranectomy right eye Operative Eye:  right eye  Surgeon: Harrold Donath Estimated Blood Loss: minimal Specimens for Pathology:  None Complications: None    The  patient was prepped and draped in the usual fashion for ocular surgery on the  right eye .  A lid speculum was placed.  Infusion line and trocar was placed at the 8 o'clock position approximately 3.5 mm from the surgical limbus.   The infusion line was allowed to run and then clamped when placed at the cannula opening. The line was inserted and secured to the drape with an adhesive strip.   Active trocars/cannula were placed at the 10 and 2 o'clock positions approximately 3.5 mm from the surgical limbus. The cannula was visualized in the vitreous cavity.  The light pipe and vitreous cutter were inserted into the vitreous cavity and the vitrector was used to carefully dissect the membranes overlying the macula and elevating the retina.  ILM forceps were used to elevate the membranes and remove them.  After the membraenectomy, the vitrector was used to carefully shave the peripheral vitreous.  ILM forceps were also used to remove subretinal bands noted in the inferior mid-periphery.  Areas of anterior loop traction were relieved with careful membranectomy and shaving the peripheral vitreous.  A complete air/fluid exchange was performed.   Endolaser was applied 360 degrees and along the inferior mid-periphery.   5000 centistoke silicone oil was then placed in the eye to achieve a near complete oil fill.  The trocars were sequentially removed and each sclerotomy closed with 8-0 Vicryl suture.  Normal intraocular pressure was noted by digital palpapation.  Subconjunctival injection of  Dexamethasone 4mg /66ml was placed.   The speculum and  drapes were removed and the eye was patched with Polymixin/Bacitracin ophthalmic ointment. An eye shield was placed and the patient was transferred alert and conversant with stable vital signs to the post operative recovery area.  The patient tolerated the procedure well and no complications were noted.  0m MD

## 2021-12-27 IMAGING — CR DG SKULL 1-3V
1 series · 1 of 1 positions shown · non-contrast
Comparison: None.

CLINICAL DATA: 29-year-old female with incorrect needle count on
facial surgery.

EXAM:
SKULL - 1-3 VIEW

[ap waters]
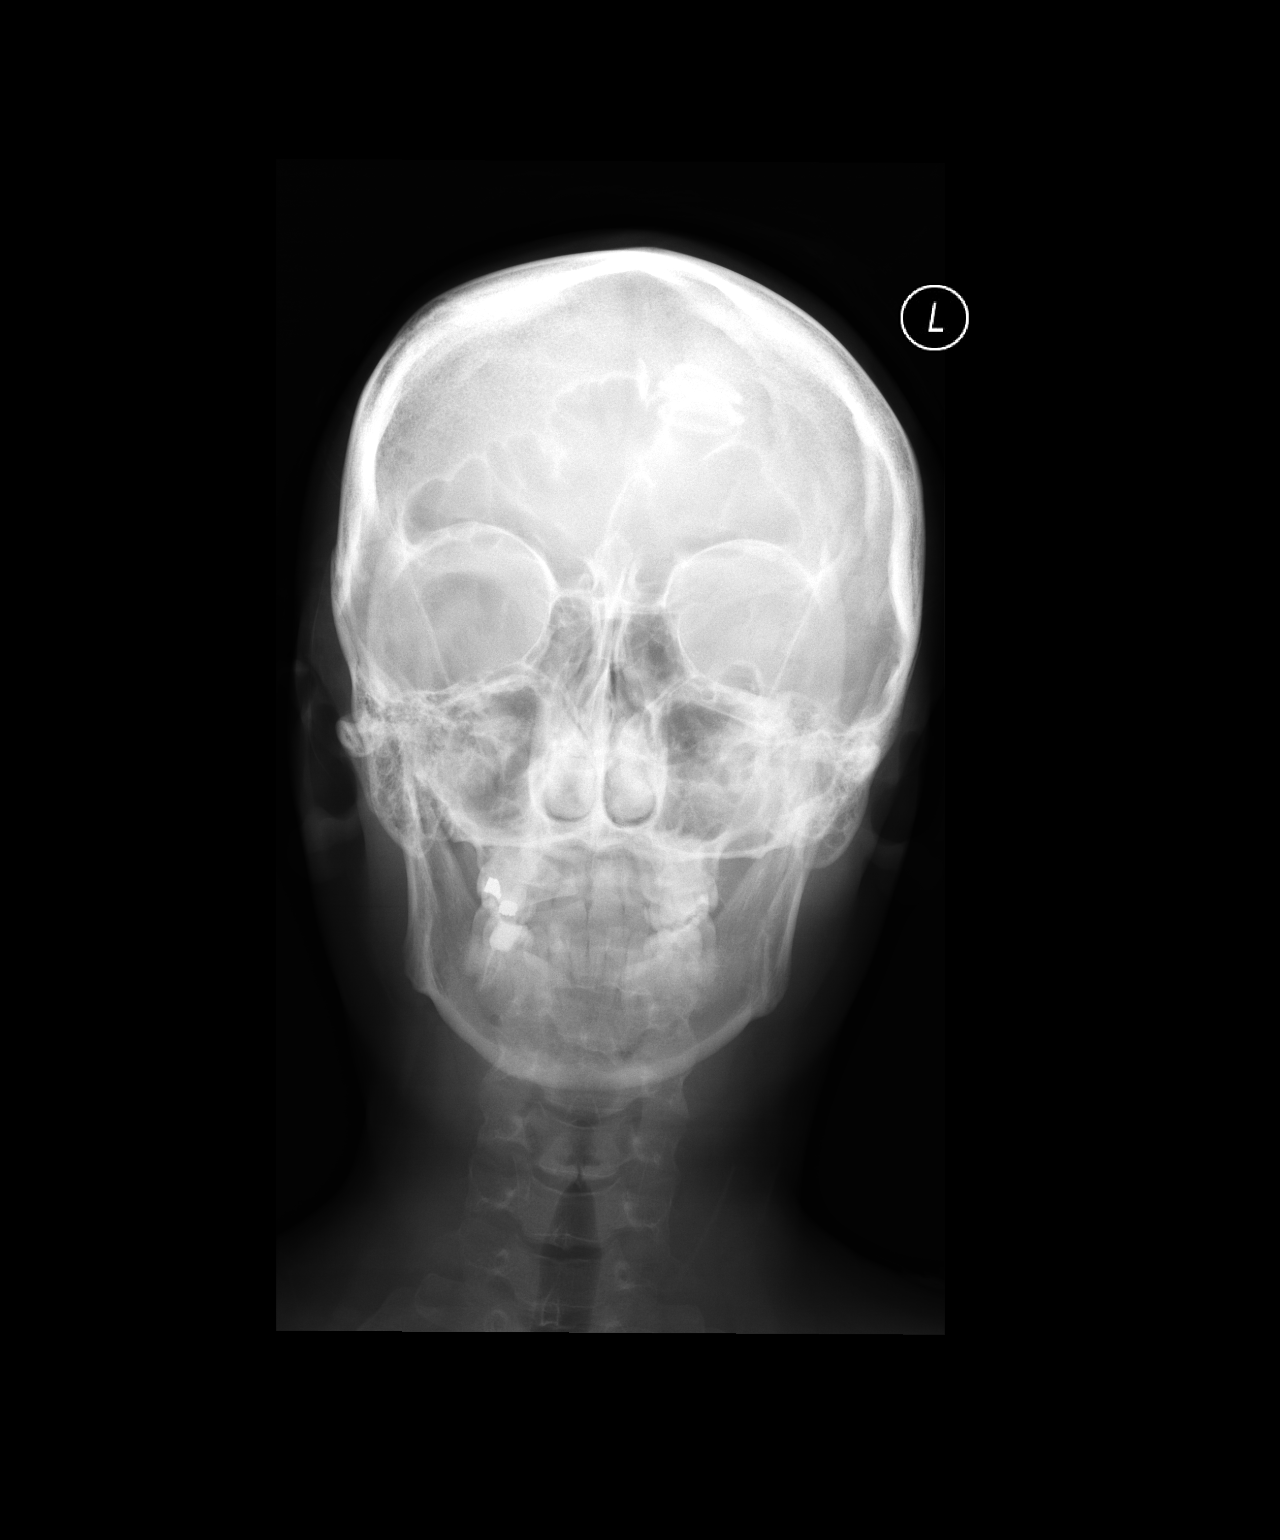

[1 of 1 positions shown; findings below may reference images not displayed]

FINDINGS: No radiopaque needle identified.

A butterfly appearing radiopaque density over the skull may be
superimposed on the patient. Clinical correlation is recommended.
IMPRESSION: No radiopaque needle identified.

These results were called by telephone at the time of interpretation
on 08/19/2020 at [DATE] to Cesarin Hayashi, who verbally
acknowledged these results.
